# Patient Record
Sex: Female | Born: 1940 | Race: White | Hispanic: No | Marital: Single | Smoking: Former smoker
Health system: Southern US, Community
[De-identification: ages and names within clinical notes are randomized; demographics above are authoritative.]

## PROBLEM LIST (undated history)

## (undated) DIAGNOSIS — I10 Essential (primary) hypertension: Secondary | ICD-10-CM

## (undated) DIAGNOSIS — M1990 Unspecified osteoarthritis, unspecified site: Secondary | ICD-10-CM

## (undated) DIAGNOSIS — E739 Lactose intolerance, unspecified: Secondary | ICD-10-CM

## (undated) DIAGNOSIS — E7800 Pure hypercholesterolemia, unspecified: Secondary | ICD-10-CM

## (undated) DIAGNOSIS — J45909 Unspecified asthma, uncomplicated: Secondary | ICD-10-CM

## (undated) DIAGNOSIS — M8580 Other specified disorders of bone density and structure, unspecified site: Secondary | ICD-10-CM

## (undated) DIAGNOSIS — H409 Unspecified glaucoma: Secondary | ICD-10-CM

## (undated) HISTORY — DX: Essential (primary) hypertension: I10

## (undated) HISTORY — PX: EYE SURGERY: SHX253

## (undated) HISTORY — DX: Unspecified osteoarthritis, unspecified site: M19.90

## (undated) HISTORY — DX: Pure hypercholesterolemia, unspecified: E78.00

## (undated) HISTORY — DX: Lactose intolerance, unspecified: E73.9

## (undated) HISTORY — PX: FRACTURE SURGERY: SHX138

## (undated) HISTORY — DX: Unspecified asthma, uncomplicated: J45.909

---

## 1999-06-21 ENCOUNTER — Other Ambulatory Visit: Admission: RE | Admit: 1999-06-21 | Discharge: 1999-06-21 | Payer: Self-pay | Admitting: Internal Medicine

## 1999-07-26 ENCOUNTER — Ambulatory Visit (HOSPITAL_COMMUNITY): Admission: RE | Admit: 1999-07-26 | Discharge: 1999-07-26 | Payer: Self-pay | Admitting: Gastroenterology

## 2002-05-22 ENCOUNTER — Encounter: Admission: RE | Admit: 2002-05-22 | Discharge: 2002-05-22 | Payer: Self-pay | Admitting: Internal Medicine

## 2002-05-22 ENCOUNTER — Encounter: Payer: Self-pay | Admitting: Internal Medicine

## 2002-10-27 ENCOUNTER — Encounter: Payer: Self-pay | Admitting: Internal Medicine

## 2002-10-27 ENCOUNTER — Encounter: Admission: RE | Admit: 2002-10-27 | Discharge: 2002-10-27 | Payer: Self-pay | Admitting: Internal Medicine

## 2003-09-29 ENCOUNTER — Other Ambulatory Visit: Admission: RE | Admit: 2003-09-29 | Discharge: 2003-09-29 | Payer: Self-pay | Admitting: Internal Medicine

## 2007-04-04 ENCOUNTER — Encounter: Admission: RE | Admit: 2007-04-04 | Discharge: 2007-04-04 | Payer: Self-pay | Admitting: Internal Medicine

## 2010-05-27 ENCOUNTER — Encounter: Payer: Self-pay | Admitting: Internal Medicine

## 2011-07-06 MED ORDER — HYDROMORPHONE HCL PF 1 MG/ML IJ SOLN
INTRAMUSCULAR | Status: AC
  Filled 2011-07-06: qty 1

## 2011-07-20 ENCOUNTER — Other Ambulatory Visit: Payer: Self-pay | Admitting: Orthopedic Surgery

## 2011-07-24 ENCOUNTER — Encounter (HOSPITAL_BASED_OUTPATIENT_CLINIC_OR_DEPARTMENT_OTHER)
Admission: RE | Admit: 2011-07-24 | Discharge: 2011-07-24 | Disposition: A | Discharge status: Home / Self Care | Payer: Medicare Other | Source: Ambulatory Visit | Attending: Orthopedic Surgery | Admitting: Orthopedic Surgery

## 2011-07-24 ENCOUNTER — Encounter (HOSPITAL_BASED_OUTPATIENT_CLINIC_OR_DEPARTMENT_OTHER): Payer: Self-pay | Admitting: *Deleted

## 2011-07-24 LAB — BASIC METABOLIC PANEL
BUN: 13 mg/dL (ref 6–23)
Calcium: 9.4 mg/dL (ref 8.4–10.5)
GFR calc Af Amer: >90 mL/min (ref >90)
GFR calc non Af Amer: 88 mL/min — ABNORMAL LOW (ref >90)
Glucose, Bld: 86 mg/dL (ref 70–99)
Sodium: 139 mEq/L (ref 135–145)

## 2011-07-24 NOTE — Progress Notes (Signed)
To come in for ekg/bmet Coming from [facility]

## 2011-07-25 ENCOUNTER — Encounter (HOSPITAL_BASED_OUTPATIENT_CLINIC_OR_DEPARTMENT_OTHER): Payer: Self-pay | Admitting: *Deleted

## 2011-07-25 ENCOUNTER — Encounter (HOSPITAL_BASED_OUTPATIENT_CLINIC_OR_DEPARTMENT_OTHER): Payer: Self-pay | Admitting: Anesthesiology

## 2011-07-25 ENCOUNTER — Ambulatory Visit (HOSPITAL_BASED_OUTPATIENT_CLINIC_OR_DEPARTMENT_OTHER): Payer: Medicare Other | Admitting: Anesthesiology

## 2011-07-25 ENCOUNTER — Ambulatory Visit (HOSPITAL_BASED_OUTPATIENT_CLINIC_OR_DEPARTMENT_OTHER)
Admission: RE | Admit: 2011-07-25 | Discharge: 2011-07-25 | Disposition: A | Discharge status: Home / Self Care | Payer: Medicare Other | Source: Ambulatory Visit | Attending: Orthopedic Surgery | Admitting: Orthopedic Surgery

## 2011-07-25 ENCOUNTER — Encounter (HOSPITAL_BASED_OUTPATIENT_CLINIC_OR_DEPARTMENT_OTHER)
Admission: RE | Disposition: A | Discharge status: Home / Self Care | Payer: Self-pay | Source: Ambulatory Visit | Attending: Orthopedic Surgery

## 2011-07-25 DIAGNOSIS — J45909 Unspecified asthma, uncomplicated: Secondary | ICD-10-CM | POA: Insufficient documentation

## 2011-07-25 DIAGNOSIS — M720 Palmar fascial fibromatosis [Dupuytren]: Secondary | ICD-10-CM | POA: Insufficient documentation

## 2011-07-25 DIAGNOSIS — Z01812 Encounter for preprocedural laboratory examination: Secondary | ICD-10-CM | POA: Insufficient documentation

## 2011-07-25 DIAGNOSIS — I10 Essential (primary) hypertension: Secondary | ICD-10-CM | POA: Insufficient documentation

## 2011-07-25 DIAGNOSIS — Z01810 Encounter for preprocedural cardiovascular examination: Secondary | ICD-10-CM | POA: Insufficient documentation

## 2011-07-25 DIAGNOSIS — G5600 Carpal tunnel syndrome, unspecified upper limb: Secondary | ICD-10-CM | POA: Insufficient documentation

## 2011-07-25 DIAGNOSIS — M1990 Unspecified osteoarthritis, unspecified site: Secondary | ICD-10-CM | POA: Insufficient documentation

## 2011-07-25 DIAGNOSIS — H409 Unspecified glaucoma: Secondary | ICD-10-CM | POA: Insufficient documentation

## 2011-07-25 HISTORY — PX: DUPUYTREN CONTRACTURE RELEASE: SHX1478

## 2011-07-25 HISTORY — PX: CARPAL TUNNEL RELEASE: SHX101

## 2011-07-25 HISTORY — DX: Unspecified glaucoma: H40.9

## 2011-07-25 HISTORY — DX: Other specified disorders of bone density and structure, unspecified site: M85.80

## 2011-07-25 LAB — POCT HEMOGLOBIN-HEMACUE: Hemoglobin: 10.9 g/dL — ABNORMAL LOW (ref 12.0–15.0)

## 2011-07-25 SURGERY — CARPAL TUNNEL RELEASE
Anesthesia: General | Site: Hand | Laterality: Right | Wound class: Clean

## 2011-07-25 MED ORDER — FENTANYL CITRATE 0.05 MG/ML IJ SOLN
INTRAMUSCULAR | Status: DC | PRN
  Administered 2011-07-25: 50 ug via INTRAVENOUS

## 2011-07-25 MED ORDER — LIDOCAINE HCL (CARDIAC) 20 MG/ML IV SOLN
INTRAVENOUS | Status: DC | PRN
  Administered 2011-07-25: 60 mg via INTRAVENOUS

## 2011-07-25 MED ORDER — FENTANYL CITRATE 0.05 MG/ML IJ SOLN: 50.0000 ug | INTRAMUSCULAR | Status: DC | PRN

## 2011-07-25 MED ORDER — CHLORHEXIDINE GLUCONATE 4 % EX LIQD: 60.0000 mL | Freq: Once | CUTANEOUS | Status: DC

## 2011-07-25 MED ORDER — MIDAZOLAM HCL 2 MG/2ML IJ SOLN: 0.5000 mg | INTRAMUSCULAR | Status: DC | PRN

## 2011-07-25 MED ORDER — ONDANSETRON HCL 4 MG/2ML IJ SOLN
INTRAMUSCULAR | Status: DC | PRN
  Administered 2011-07-25: 4 mg via INTRAVENOUS

## 2011-07-25 MED ORDER — PROPOFOL 10 MG/ML IV EMUL
INTRAVENOUS | Status: DC | PRN
  Administered 2011-07-25: 200 mg via INTRAVENOUS

## 2011-07-25 MED ORDER — OXYCODONE HCL 5 MG PO TABS: 5.0000 mg | ORAL_TABLET | Freq: Once | ORAL | Status: DC | PRN

## 2011-07-25 MED ORDER — MIDAZOLAM HCL 5 MG/5ML IJ SOLN
INTRAMUSCULAR | Status: DC | PRN
  Administered 2011-07-25 (×2): 1 mg via INTRAVENOUS

## 2011-07-25 MED ORDER — LIDOCAINE HCL 2 % IJ SOLN
INTRAMUSCULAR | Status: DC | PRN
  Administered 2011-07-25: 3 mL

## 2011-07-25 MED ORDER — OXYCODONE-ACETAMINOPHEN 5-325 MG PO TABS: 1.0000 | ORAL_TABLET | Freq: Four times a day (QID) | ORAL | Status: DC | PRN

## 2011-07-25 MED ORDER — METOCLOPRAMIDE HCL 5 MG/ML IJ SOLN
INTRAMUSCULAR | Status: DC | PRN
  Administered 2011-07-25: 10 mg via INTRAVENOUS

## 2011-07-25 MED ORDER — METOCLOPRAMIDE HCL 5 MG/ML IJ SOLN: 10.0000 mg | Freq: Once | INTRAMUSCULAR | Status: DC | PRN

## 2011-07-25 MED ORDER — 0.9 % SODIUM CHLORIDE (POUR BTL) OPTIME
TOPICAL | Status: DC | PRN
  Administered 2011-07-25: 1000 mL

## 2011-07-25 MED ORDER — DEXAMETHASONE SODIUM PHOSPHATE 10 MG/ML IJ SOLN
INTRAMUSCULAR | Status: DC | PRN
  Administered 2011-07-25: 10 mg via INTRAVENOUS

## 2011-07-25 MED ORDER — LACTATED RINGERS IV SOLN
INTRAVENOUS | Status: DC
  Administered 2011-07-25: 08:00:00 via INTRAVENOUS

## 2011-07-25 MED ORDER — FENTANYL CITRATE 0.05 MG/ML IJ SOLN: 25.0000 ug | INTRAMUSCULAR | Status: DC | PRN

## 2011-07-25 SURGICAL SUPPLY — 55 items
BANDAGE ADHESIVE 1X3 (GAUZE/BANDAGES/DRESSINGS) IMPLANT
BANDAGE CONFORM 3  STR LF (GAUZE/BANDAGES/DRESSINGS) IMPLANT
BANDAGE ELASTIC 3 VELCRO ST LF (GAUZE/BANDAGES/DRESSINGS) ×2 IMPLANT
BANDAGE GAUZE ELAST BULKY 4 IN (GAUZE/BANDAGES/DRESSINGS) ×3 IMPLANT
BLADE MINI RND TIP GREEN BEAV (BLADE) ×1 IMPLANT
BLADE SURG 15 STRL LF DISP TIS (BLADE) ×1 IMPLANT
BLADE SURG 15 STRL SS (BLADE) ×2
BNDG CMPR 9X4 STRL LF SNTH (GAUZE/BANDAGES/DRESSINGS) ×1
BNDG ESMARK 4X9 LF (GAUZE/BANDAGES/DRESSINGS) ×1 IMPLANT
BRUSH SCRUB EZ PLAIN DRY (MISCELLANEOUS) ×2 IMPLANT
CLOTH BEACON ORANGE TIMEOUT ST (SAFETY) ×1 IMPLANT
CORDS BIPOLAR (ELECTRODE) ×2 IMPLANT
COVER MAYO STAND STRL (DRAPES) ×2 IMPLANT
COVER TABLE BACK 60X90 (DRAPES) ×2 IMPLANT
CUFF TOURNIQUET SINGLE 18IN (TOURNIQUET CUFF) ×1 IMPLANT
DECANTER SPIKE VIAL GLASS SM (MISCELLANEOUS) IMPLANT
DRAPE EXTREMITY T 121X128X90 (DRAPE) ×2 IMPLANT
DRAPE SURG 17X23 STRL (DRAPES) ×2 IMPLANT
DRSG EMULSION OIL 3X3 NADH (GAUZE/BANDAGES/DRESSINGS) ×3 IMPLANT
GLOVE BIO SURGEON STRL SZ 6.5 (GLOVE) ×1 IMPLANT
GLOVE BIOGEL M STRL SZ7.5 (GLOVE) ×1 IMPLANT
GLOVE BIOGEL PI IND STRL 7.0 (GLOVE) IMPLANT
GLOVE BIOGEL PI INDICATOR 7.0 (GLOVE) ×1
GLOVE EXAM NITRILE EXT CUFF MD (GLOVE) ×1 IMPLANT
GLOVE ORTHO TXT STRL SZ7.5 (GLOVE) ×2 IMPLANT
GOWN PREVENTION PLUS XLARGE (GOWN DISPOSABLE) ×2 IMPLANT
GOWN PREVENTION PLUS XXLARGE (GOWN DISPOSABLE) ×3 IMPLANT
LOOP VESSEL MAXI BLUE (MISCELLANEOUS) ×1 IMPLANT
NDL HYPO 25X1 1.5 SAFETY (NEEDLE) IMPLANT
NEEDLE 27GAX1X1/2 (NEEDLE) ×1 IMPLANT
NEEDLE HYPO 25X1 1.5 SAFETY (NEEDLE) IMPLANT
NS IRRIG 1000ML POUR BTL (IV SOLUTION) ×2 IMPLANT
PACK BASIN DAY SURGERY FS (CUSTOM PROCEDURE TRAY) ×2 IMPLANT
PAD CAST 3X4 CTTN HI CHSV (CAST SUPPLIES) ×1 IMPLANT
PADDING CAST ABS 4INX4YD NS (CAST SUPPLIES) ×1
PADDING CAST ABS COTTON 4X4 ST (CAST SUPPLIES) ×1 IMPLANT
PADDING CAST COTTON 3X4 STRL (CAST SUPPLIES) ×2
SLEEVE SCD COMPRESS KNEE MED (MISCELLANEOUS) ×1 IMPLANT
SPLINT PLASTER CAST XFAST 3X15 (CAST SUPPLIES) ×5 IMPLANT
SPLINT PLASTER XTRA FASTSET 3X (CAST SUPPLIES) ×5
SPONGE GAUZE 4X4 12PLY (GAUZE/BANDAGES/DRESSINGS) ×2 IMPLANT
STOCKINETTE 4X48 STRL (DRAPES) ×2 IMPLANT
STRIP CLOSURE SKIN 1/2X4 (GAUZE/BANDAGES/DRESSINGS) ×2 IMPLANT
SUT ETHILON 5 0 P 3 18 (SUTURE)
SUT NYLON ETHILON 5-0 P-3 1X18 (SUTURE) ×2 IMPLANT
SUT PROLENE 3 0 PS 2 (SUTURE) ×3 IMPLANT
SUT SILK 4 0 PS 2 (SUTURE) ×1 IMPLANT
SUT VIC AB 4-0 P2 18 (SUTURE) IMPLANT
SYR 3ML 23GX1 SAFETY (SYRINGE) IMPLANT
SYR BULB 3OZ (MISCELLANEOUS) ×1 IMPLANT
SYR CONTROL 10ML LL (SYRINGE) ×1 IMPLANT
TOWEL OR 17X24 6PK STRL BLUE (TOWEL DISPOSABLE) ×4 IMPLANT
TRAY DSU PREP LF (CUSTOM PROCEDURE TRAY) ×2 IMPLANT
UNDERPAD 30X30 INCONTINENT (UNDERPADS AND DIAPERS) ×2 IMPLANT
WATER STERILE IRR 1000ML POUR (IV SOLUTION) ×1 IMPLANT

## 2011-07-25 NOTE — H&P (Signed)
Maria Kelley is an 71 y.o. female.   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  Past Medical History  Diagnosis Date  . Essential hypertension, malignant   . Pure hypercholesterolemia   . Extrinsic asthma, unspecified     no inhalers  . Osteoarthrosis, unspecified whether generalized or localized, unspecified site   . Osteopenia   . Glaucoma     Past Surgical History  Procedure Date  . Eye surgery     both cataracts  . Fracture surgery     rt lower leg    No family history on file. Social History:  reports that she quit smoking about 30 years ago. She does not have any smokeless tobacco history on file. She reports that she drinks alcohol. She reports that she does not use illicit drugs.  Allergies: No Known Allergies  Medications Prior to Admission  Medication Sig Dispense Refill  . amLODipine (NORVASC) 5 MG tablet Take 5 mg by mouth daily.      . benazepril (LOTENSIN) 20 MG tablet Take 20 mg by mouth daily.      . Calcium Carbonate-Vitamin D (CALCIUM + D PO) Take by mouth daily.        . IBUPROFEN PO Take 200 mg by mouth daily.        Marland Kitchen MAGNESIUM PO Take by mouth daily.        . metoprolol (TOPROL-XL) 100 MG 24 hr tablet Take 100 mg by mouth 2 (two) times daily. 1/2 tablet bid      . Omega-3 Fatty Acids (OMEGA 3 PO) Take by mouth daily.        . Timolol Maleate (TIMOPTIC OP) Apply to eye 2 (two) times daily.        . travoprost, benzalkonium, (TRAVATAN Z) 0.004 % ophthalmic solution 1 drop at bedtime.        Marland Kitchen ipratropium (ATROVENT HFA) 17 MCG/ACT inhaler Inhale 2 puffs into the lungs 2 (two) times daily as needed.          Results for orders placed during the hospital encounter of 07/25/11 (from the past 48 hour(s))  BASIC METABOLIC PANEL     Status: Abnormal   Collection Time   07/24/11 12:35 PM      Component Value Range Comment   Sodium 139  135 -  145 (mEq/L)    Potassium 5.4 (*) 3.5 - 5.1 (mEq/L)    Chloride 103  96 - 112 (mEq/L)    CO2 29  19 - 32 (mEq/L)    Glucose, Bld 86  70 - 99 (mg/dL)    BUN 13  6 - 23 (mg/dL)    Creatinine, Ser 1.61  0.50 - 1.10 (mg/dL)    Calcium 9.4  8.4 - 10.5 (mg/dL)    GFR calc non Af Amer 88 (*) >90 (mL/min)    GFR calc Af Amer >90  >90 (mL/min)   POCT HEMOGLOBIN-HEMACUE     Status: Abnormal   Collection Time   07/25/11  7:41 AM      Component Value Range Comment   Hemoglobin 10.9 (*) 12.0 - 15.0 (g/dL)    No results found.  Review of Systems  Constitutional: Negative.   Eyes: Negative.   Respiratory: Negative.   Cardiovascular: Negative.   Gastrointestinal: Negative.   Genitourinary: Negative.   Musculoskeletal: Negative.   Skin: Negative.   Neurological: Positive for tingling and sensory  change.  Endo/Heme/Allergies: Negative.   Psychiatric/Behavioral: Negative.     Blood pressure 130/78, pulse 49, temperature 98.2 F (36.8 C), temperature source Oral, resp. rate 16, height 5' (1.524 m), weight 56.7 kg (125 lb), SpO2 98.00%. Physical Exam  Constitutional: She is oriented to person, place, and time. She appears well-developed.  HENT:  Head: Normocephalic and atraumatic.  Eyes: Pupils are equal, round, and reactive to light.  Neck: Normal range of motion.  Cardiovascular: Normal rate.   Respiratory: Breath sounds normal.  GI: Soft.  Musculoskeletal: Normal range of motion.  Neurological: She is alert and oriented to person, place, and time.  Skin: Skin is warm.   Numbness in right median distribution with positive direct compression test  Assessment/Plan Right carpal tunnel syndrome with palmar fibromatosis of long finger ray  Right carpal tunnel release with excision of palmar fascia  Aundraya Dripps JR,Maalik Pinn V 07/25/2011, 8:37 AM

## 2011-07-25 NOTE — Anesthesia Postprocedure Evaluation (Signed)
Anesthesia Post Note  Patient: Maria Kelley  Procedure(s) Performed: Procedure(s) (LRB): CARPAL TUNNEL RELEASE (Right) DUPUYTREN CONTRACTURE RELEASE (Right)  Anesthesia type: General  Patient location: PACU  Post pain: Pain level controlled  Post assessment: Patient's Cardiovascular Status Stable  Last Vitals:  Filed Vitals:   07/25/11 1104  BP: 139/65  Pulse: 55  Temp: 36.5 C  Resp: 20    Post vital signs: Reviewed and stable  Level of consciousness: alert  Complications: No apparent anesthesia complications

## 2011-07-25 NOTE — Anesthesia Preprocedure Evaluation (Addendum)
Anesthesia Evaluation  Patient identified by MRN, date of birth, ID band Patient awake    Reviewed: Allergy & Precautions, H&P , NPO status , Patient's Chart, lab work & pertinent test results, reviewed documented beta blocker date and time   Airway Mallampati: II TM Distance: >3 FB Neck ROM: full    Dental   Pulmonary asthma ,          Cardiovascular hypertension, On Medications and On Home Beta Blockers     Neuro/Psych negative neurological ROS  negative psych ROS   GI/Hepatic negative GI ROS, Neg liver ROS,   Endo/Other  negative endocrine ROS  Renal/GU negative Renal ROS  negative genitourinary   Musculoskeletal   Abdominal   Peds  Hematology negative hematology ROS (+)   Anesthesia Other Findings See surgeon's H&P   Reproductive/Obstetrics negative OB ROS                           Anesthesia Physical Anesthesia Plan  ASA: II  Anesthesia Plan: General   Post-op Pain Management:    Induction:   Airway Management Planned: LMA  Additional Equipment:   Intra-op Plan:   Post-operative Plan: Extubation in OR  Informed Consent: I have reviewed the patients History and Physical, chart, labs and discussed the procedure including the risks, benefits and alternatives for the proposed anesthesia with the patient or authorized representative who has indicated his/her understanding and acceptance.   Dental Advisory Given  Plan Discussed with: CRNA and Surgeon  Anesthesia Plan Comments:         Anesthesia Quick Evaluation

## 2011-07-25 NOTE — Transfer of Care (Signed)
Immediate Anesthesia Transfer of Care Note  Patient: Maria Kelley  Procedure(s) Performed: Procedure(s) (LRB): CARPAL TUNNEL RELEASE (Right) DUPUYTREN CONTRACTURE RELEASE (Right)  Patient Location: PACU  Anesthesia Type: General  Level of Consciousness: sedated  Airway & Oxygen Therapy: Patient Spontanous Breathing and Patient connected to face mask oxygen  Post-op Assessment: Report given to PACU RN and Post -op Vital signs reviewed and stable  Post vital signs: Reviewed and stable  Complications: No apparent anesthesia complications

## 2011-07-25 NOTE — Anesthesia Procedure Notes (Signed)
Procedure Name: LMA Insertion Date/Time: 07/25/2011 8:54 AM Performed by: Burna Cash Pre-anesthesia Checklist: Patient identified, Emergency Drugs available, Suction available and Patient being monitored Patient Re-evaluated:Patient Re-evaluated prior to inductionOxygen Delivery Method: Circle System Utilized Preoxygenation: Pre-oxygenation with 100% oxygen Intubation Type: IV induction Ventilation: Mask ventilation without difficulty LMA: LMA inserted LMA Size: 4.0 Number of attempts: 1 Airway Equipment and Method: bite block Placement Confirmation: positive ETCO2 Tube secured with: Tape Dental Injury: Teeth and Oropharynx as per pre-operative assessment

## 2011-07-25 NOTE — Op Note (Signed)
Dictated [id number]

## 2011-07-25 NOTE — Brief Op Note (Signed)
07/25/2011  9:27 AM  PATIENT:  Maria Kelley  71 y.o. female  PRE-OPERATIVE DIAGNOSIS:  Carpal tunnel syndrome right, dupuytrens right long  POST-OPERATIVE DIAGNOSIS:  Carpal tunnel syndrome right, dupuytrens right long  PROCEDURE:   CARPAL TUNNEL RELEASE (Right) DUPUYTREN CONTRACTURE PALMAR FASCIA EXCISION RIGHT PALM  SURGEON:      * Wyn Forster., MD - Primary  PHYSICIAN ASSISTANT:   ASSISTANTS: nurse  ANESTHESIA:   general  EBL:     BLOOD ADMINISTERED:none  DRAINS: none   LOCAL MEDICATIONS USED:  XYLOCAINE   SPECIMEN:  No Specimen  DISPOSITION OF SPECIMEN:  N/A  COUNTS:  YES  TOURNIQUET:   Total Tourniquet Time Documented: Upper Arm (Right) - 19 minutes  DICTATION: .Other Dictation: Dictation Number [id number]  PLAN OF CARE: Discharge to home after PACU  PATIENT DISPOSITION:  PACU - hemodynamically stable.

## 2011-07-25 NOTE — Discharge Instructions (Signed)
  Post Anesthesia Home Care Instructions  Activity: Get plenty of rest for the remainder of the day. A responsible adult should stay with you for 24 hours following the procedure.  For the next 24 hours, DO NOT: -Drive a car -Advertising copywriter -Drink alcoholic beverages -Take any medication unless instructed by your physician -Make any legal decisions or sign important papers.  Meals: Start with liquid foods such as gelatin or soup. Progress to regular foods as tolerated. Avoid greasy, spicy, heavy foods. If nausea and/or vomiting occur, drink only clear liquids until the nausea and/or vomiting subsides. Call your physician if vomiting continues.  Special Instructions/Symptoms: Your throat may feel dry or sore from the anesthesia or the breathing tube placed in your throat during surgery. If this causes discomfort, gargle with warm salt water. The discomfort should disappear within 24 hours.     You develop bleeding from your surgical site.   You have an oral temperature above 102 F (38.9 C).   You develop redness or swelling of the surgical site.   You develop new, unexplained problems.  SEEK IMMEDIATE MEDICAL CARE IF:   You develop a rash.   You have difficulty breathing.   You develop any reaction or side effects to medicines given.  MAKE SURE YOU:   Understand these instructions.   Will watch your condition.   Will get help right away if you are not doing well or get worse.  Document Released: 08/19/2004 Document Revised: 01/19/2011 Document Reviewed: 12/06/2006 [facility] Patient Information 2012 [place], [place].

## 2011-07-26 ENCOUNTER — Encounter (HOSPITAL_BASED_OUTPATIENT_CLINIC_OR_DEPARTMENT_OTHER): Payer: Self-pay | Admitting: Orthopedic Surgery

## 2011-07-26 NOTE — Op Note (Signed)
NAMESANVI, Maria Kelley               ACCOUNT NO.:  [id number]  MEDICAL RECORD NO.:  [id number]  LOCATION:                                 FACILITY:  PHYSICIAN:  Katy Fitch. Dawanna Grauberger, M.D.      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  OPERATIONS: 1. Release of right transverse carpal ligament. 2. Resection of pretendinous fibers of palmar fascia with pathologic     palmar fibromatosis, right palm.  OPERATING SURGEON:  Katy Fitch. Charda Janis, MD  ASSISTANT:  None.  ANESTHESIA:  General by LMA.  SUPERVISING ANESTHESIOLOGIST:  Janetta Hora. Gelene Mink, MD  INDICATIONS:  Ashante Yellin is a 71 year old woman referred through the courtesy of Dr. Willey Blade for evaluation and management of hand numbness and palmar fibromatosis.  Clinical examination of her hand revealed a significant palmar fibromatosis involving the pretendinous fibers of the right long finger.  She had significant numbness and mild thenar atrophy.  She was evaluated by Dr. Johna Roles with detailed electrodiagnostic studies, which confirmed severe right carpal tunnel syndrome and moderate left carpal tunnel syndrome.  Due to a failure to respond to nonoperative measures, she is brought to the operating room at this time for release of her right transverse carpal ligament and resection of her pathologic fascia.  Preoperatively, she was reminded the potential risks and benefits of surgery.  Questions were invited and answered in detail.  PROCEDURE:  Naydene Kamrowski was brought to room #6 of [facility] and placed in supine position on the operating table.  Following detailed anesthesia and informed consent by Dr. Gelene Mink, general anesthesia by LMA technique was recommended and accepted.  In room #6 under Dr. Thornton Dales direct supervision, general anesthesia by LMA technique was induced followed by routine Betadine scrub and paint of the right upper extremity.  A pneumatic tourniquet was applied to the proximal right brachium.  Following routine surgical time-out, the right hand and arm were exsanguinated with Esmarch bandage and arterial tourniquet was inflated to 220 mmHg.  Procedure commenced with a short incision in the line of the ring finger of the palm.  Subcutaneous tissues were carefully divided revealing the palmar fascia.  This was split longitudinally to reveal the common sensory branch of the median nerve.  These were followed back to the median nerve proper, which was gently isolated from the transverse carpal ligament with a Insurance risk surveyor.  A subcutaneous pathway was created into the distal forearm followed by release of the ulnar bursa along its ulnar border extending into the distal forearm.  This widely opened the carpal canal.  No masses or other predicaments were noted.  Bleeding points along the margin of the released ligament were electrocauterized with bipolar current.  Attention was then directed to the midpalm.  A Brunner zigzag incision  was fashioned between the distal and middle palmar creases.  A 2-cm segment of the pathologic palmar fascia was resected as well as its extensions to the transverse bands of the palmar fascia.  Care was taken to identify and protect the neurovascular structures as well as the flexor tendons.  This relieved the significant contracture in the palm.  Both wounds were then irrigated, inspected for bleeding points and repaired with intradermal 3-0 Prolene and  Steri-Strips.  A 3 mL of 2% lidocaine were infiltrated along the wound margins and around the median nerve for postoperative analgesia.  The hand was then dressed with sterile gauze, sterile Kerlix, sterile Webril, and a volar plaster splint maintaining the wrist in 15 degrees of dorsiflexion.  For aftercare, Ms. Kluger is provided a prescription for Percocet 5 mg 1 p.o. q.4-6 hours p.r.n. pain, 20 tablets without refill.     Katy Fitch Smera Guyette, M.D.     RVS/MEDQ  D:  07/25/2011  T:  07/25/2011  Job:  [id number]

## 2012-03-01 LAB — BASIC METABOLIC PANEL
BUN: 16 mg/dL (ref 4–21)
Creatinine: 0.6 mg/dL (ref 0.5–1.1)

## 2012-03-01 LAB — HEPATIC FUNCTION PANEL
AST: 15 U/L (ref 13–35)
Alkaline Phosphatase: 41 U/L (ref 25–125)
Bilirubin, Total: 0.9 mg/dL

## 2012-03-01 LAB — LIPID PANEL
HDL: 66 mg/dL (ref 35–70)
LDL Cholesterol: 12 mg/dL

## 2012-04-04 ENCOUNTER — Encounter: Payer: Self-pay | Admitting: *Deleted

## 2012-04-12 ENCOUNTER — Encounter: Payer: Self-pay | Admitting: Hematology

## 2013-05-06 ENCOUNTER — Ambulatory Visit: Payer: Medicare Other | Admitting: Physician Assistant

## 2013-08-25 ENCOUNTER — Other Ambulatory Visit (HOSPITAL_COMMUNITY): Payer: Self-pay | Admitting: Internal Medicine

## 2013-08-25 ENCOUNTER — Ambulatory Visit (HOSPITAL_COMMUNITY): Payer: Medicare PPO

## 2013-08-25 DIAGNOSIS — M7989 Other specified soft tissue disorders: Secondary | ICD-10-CM

## 2013-08-25 DIAGNOSIS — M25562 Pain in left knee: Secondary | ICD-10-CM

## 2013-08-26 ENCOUNTER — Ambulatory Visit (HOSPITAL_COMMUNITY)
Admission: RE | Admit: 2013-08-26 | Discharge: 2013-08-26 | Disposition: A | Discharge status: Home / Self Care | Payer: Medicare PPO | Source: Ambulatory Visit | Attending: Internal Medicine | Admitting: Internal Medicine

## 2013-08-26 DIAGNOSIS — M7989 Other specified soft tissue disorders: Secondary | ICD-10-CM | POA: Insufficient documentation

## 2013-08-26 DIAGNOSIS — M79609 Pain in unspecified limb: Secondary | ICD-10-CM

## 2013-08-26 DIAGNOSIS — M25562 Pain in left knee: Secondary | ICD-10-CM

## 2013-08-26 NOTE — Progress Notes (Signed)
VASCULAR LAB PRELIMINARY  PRELIMINARY  PRELIMINARY  PRELIMINARY  Left lower extremity venous duplex completed.    Preliminary report:  Left:  No evidence of DVTor superficial thrombosis. Positive for  a ruptured Baker's cyst with fluid coursing approximately 9 cm from the popliteal fossa to the proximal calf.  Haik Mahoney, RVS 08/26/2013, 10:40 AM

## 2015-03-23 ENCOUNTER — Emergency Department (HOSPITAL_COMMUNITY)
Admission: EM | Admit: 2015-03-23 | Discharge: 2015-03-23 | Disposition: A | Discharge status: Home / Self Care | Payer: Medicare HMO | Attending: Emergency Medicine | Admitting: Emergency Medicine

## 2015-03-23 ENCOUNTER — Encounter (HOSPITAL_COMMUNITY): Payer: Self-pay

## 2015-03-23 ENCOUNTER — Emergency Department (HOSPITAL_COMMUNITY): Payer: Medicare HMO

## 2015-03-23 DIAGNOSIS — Z79899 Other long term (current) drug therapy: Secondary | ICD-10-CM | POA: Diagnosis not present

## 2015-03-23 DIAGNOSIS — Z87891 Personal history of nicotine dependence: Secondary | ICD-10-CM | POA: Diagnosis not present

## 2015-03-23 DIAGNOSIS — J45909 Unspecified asthma, uncomplicated: Secondary | ICD-10-CM | POA: Insufficient documentation

## 2015-03-23 DIAGNOSIS — Z8639 Personal history of other endocrine, nutritional and metabolic disease: Secondary | ICD-10-CM | POA: Insufficient documentation

## 2015-03-23 DIAGNOSIS — I10 Essential (primary) hypertension: Secondary | ICD-10-CM | POA: Insufficient documentation

## 2015-03-23 DIAGNOSIS — H409 Unspecified glaucoma: Secondary | ICD-10-CM | POA: Insufficient documentation

## 2015-03-23 DIAGNOSIS — M25531 Pain in right wrist: Secondary | ICD-10-CM | POA: Insufficient documentation

## 2015-03-23 DIAGNOSIS — M1990 Unspecified osteoarthritis, unspecified site: Secondary | ICD-10-CM | POA: Diagnosis not present

## 2015-03-23 MED ORDER — IBUPROFEN 200 MG PO TABS
600.0000 mg | ORAL_TABLET | Freq: Once | ORAL | Status: AC
  Administered 2015-03-23: 600 mg via ORAL
  Filled 2015-03-23: qty 3

## 2015-03-23 NOTE — ED Notes (Signed)
Pt states started having pain in rt hand last night.  No injury.  Some swelling noted in hand.  No hx of same.

## 2015-03-23 NOTE — Discharge Instructions (Signed)
Follow up with your hand surgeon.  Im not sure if Dr. Teressa Senter is still working.  Please call the number provided. Take  of ibuprofen 3 times a day with meals.  Wear the splint for comfort at and night.  Joint Pain Joint pain, which is also called arthralgia, can be caused by many things. Joint pain often goes away when you follow your health care provider's instructions for relieving pain at home. However, joint pain can also be caused by conditions that require further treatment. Common causes of joint pain include:  Bruising in the area of the joint.  Overuse of the joint.  Wear and tear on the joints that occur with aging (osteoarthritis).  Various other forms of arthritis.  A buildup of a crystal form of uric acid in the joint (gout).  Infections of the joint (septic arthritis) or of the bone (osteomyelitis). Your health care provider may recommend medicine to help with the pain. If your joint pain continues, additional tests may be needed to diagnose your condition. HOME CARE INSTRUCTIONS Watch your condition for any changes. Follow these instructions as directed to lessen the pain that you are feeling.  Take medicines only as directed by your health care provider.  Rest the affected area for as long as your health care provider says that you should. If directed to do so, raise the painful joint above the level of your heart while you are sitting or lying down.  Do not do things that cause or worsen pain.  If directed, apply ice to the painful area:  Put ice in a plastic bag.  Place a towel between your skin and the bag.  Leave the ice on for 20 minutes, 2-3 times per day.  Wear an elastic bandage, splint, or sling as directed by your health care provider. Loosen the elastic bandage or splint if your fingers or toes become numb and tingle, or if they turn cold and blue.  Begin exercising or stretching the affected area as directed by your health care provider. Ask your  health care provider what types of exercise are safe for you.  Keep all follow-up visits as directed by your health care provider. This is important. SEEK MEDICAL CARE IF:  Your pain increases, and medicine does not help.  Your joint pain does not improve within 3 days.  You have increased bruising or swelling.  You have a fever.  You lose 10 lb (4.5 kg) or more without trying. SEEK IMMEDIATE MEDICAL CARE IF:  You are not able to move the joint.  Your fingers or toes become numb or they turn cold and blue.   This information is not intended to replace advice given to you by your health care provider. Make sure you discuss any questions you have with your health care provider.   Document Released: 01/30/2005 Document Revised: 02/20/2014 Document Reviewed: 11/11/2013 [employer] Interactive Patient Education [employer]

## 2015-03-23 NOTE — ED Provider Notes (Signed)
CSN: [id number]     Arrival date & time 03/23/15  0940 History   First MD Initiated Contact with Patient 03/23/15 1006     Chief Complaint  Patient presents with  . Hand Pain     (Consider location/radiation/quality/duration/timing/severity/associated sxs/prior Treatment) Patient is a 75 y.o. female presenting with hand pain. The history is provided by the patient.  Hand Pain This is a new problem. The current episode started less than 1 hour ago. The problem occurs constantly. The problem has not changed since onset.Pertinent negatives include no chest pain, no abdominal pain, no headaches and no shortness of breath. Nothing aggravates the symptoms. Nothing relieves the symptoms. She has tried nothing for the symptoms. The treatment provided no relief.   61 yoF with a chief complaint of right wrist pain. The started yesterday when she was playing bridge. Patient noticed that the pain is worse with movement of her wrist. Pain radiates up into her first 3 digits of the right hand. Denies paresthesias denies fever chills. Denies history of blood clots. Denies other injury.  Past Medical History  Diagnosis Date  . Essential hypertension, malignant   . Pure hypercholesterolemia   . Extrinsic asthma, unspecified     no inhalers  . Osteoarthrosis, unspecified whether generalized or localized, unspecified site   . Osteopenia   . Glaucoma   . DJD (degenerative joint disease)   . Lactose intolerance    Past Surgical History  Procedure Laterality Date  . Fracture surgery      rt lower leg  . Carpal tunnel release  07/25/2011    Procedure: CARPAL TUNNEL RELEASE;  Surgeon: Wyn Forster., MD;  Location: [facility];  Service: Orthopedics;  Laterality: Right;  . Dupuytren contracture release  07/25/2011    Procedure: DUPUYTREN CONTRACTURE RELEASE;  Surgeon: Wyn Forster., MD;  Location: [facility];  Service: Orthopedics;  Laterality: Right;  Resection Right  Long Dupuytrens  . Eye surgery      both cataracts   Family History  Problem Relation Age of Onset  . Arthritis Mother   . Heart disease Father    Social History  Substance Use Topics  . Smoking status: Former Smoker    Quit date: 07/23/1981  . Smokeless tobacco: None  . Alcohol Use: Yes   OB History    No data available     Review of Systems  Constitutional: Negative for fever and chills.  HENT: Negative for congestion and rhinorrhea.   Eyes: Negative for redness and visual disturbance.  Respiratory: Negative for shortness of breath and wheezing.   Cardiovascular: Negative for chest pain and palpitations.  Gastrointestinal: Negative for nausea, vomiting and abdominal pain.  Genitourinary: Negative for dysuria and urgency.  Musculoskeletal: Positive for myalgias and arthralgias.  Skin: Negative for pallor and wound.  Neurological: Negative for dizziness and headaches.      Allergies  Review of patient's allergies indicates no known allergies.  Home Medications   Prior to Admission medications   Medication Sig Start Date End Date Taking? Authorizing Provider  acidophilus (RISAQUAD) CAPS capsule Take 1 capsule by mouth daily.   Yes Historical Provider, MD  amLODipine (NORVASC) 5 MG tablet Take 5 mg by mouth daily.   Yes Historical Provider, MD  benazepril (LOTENSIN) 10 MG tablet Take 10 mg by mouth daily.   Yes Historical Provider, MD  ibuprofen (ADVIL,MOTRIN) 200 MG tablet Take 400 mg by mouth every 6 (six) hours as needed for headache  or moderate pain.   Yes Historical Provider, MD  magnesium oxide (MAG-OX) 400 MG tablet Take 400 mg by mouth daily.   Yes Historical Provider, MD  metoprolol (LOPRESSOR) 50 MG tablet Take 1 tablet by mouth 2 (two) times daily. 02/12/15  Yes Historical Provider, MD  terbinafine (LAMISIL) 250 MG tablet Take 250 mg by mouth daily. APPROXIMATE COURSE DATES 03/05/15-03/26/15; PT IS UNABLE TO CONFIRM EXACT DATES OF DOSE 03/05/15  Yes Historical  Provider, MD  timolol (TIMOPTIC) 0.5 % ophthalmic solution Place 1 drop into both eyes 2 (two) times daily. 02/09/15  Yes Historical Provider, MD  travoprost, benzalkonium, (TRAVATAN Z) 0.004 % ophthalmic solution Place 1 drop into both eyes at bedtime.    Yes Historical Provider, MD   BP 150/87 mmHg  Pulse 60  Temp(Src) 98.4 F (36.9 C) (Oral)  Resp 18  SpO2 100% Physical Exam  Constitutional: She is oriented to person, place, and time. She appears well-developed and well-nourished. No distress.  HENT:  Head: Normocephalic and atraumatic.  Eyes: EOM are normal. Pupils are equal, round, and reactive to light.  Neck: Normal range of motion. Neck supple.  Cardiovascular: Normal rate and regular rhythm.  Exam reveals no gallop and no friction rub.   No murmur heard. Pulmonary/Chest: Effort normal. She has no wheezes. She has no rales.  Abdominal: Soft. She exhibits no distension. There is no tenderness. There is no rebound and no guarding.  Musculoskeletal: She exhibits no edema or tenderness.       Hands: Multiple fingers with trigger finger.  Neurological: She is alert and oriented to person, place, and time.  Skin: Skin is warm and dry. She is not diaphoretic.  Psychiatric: She has a normal mood and affect. Her behavior is normal.  Nursing note and vitals reviewed.   ED Course  Procedures (including critical care time) Labs Review Labs Reviewed - No data to display  Imaging Review Dg Wrist Complete Right  03/23/2015  CLINICAL DATA:  75 year old female with generalized right wrist pain since last night with no known injury. Symptoms increase with motion. Initial encounter. EXAM: RIGHT WRIST - COMPLETE 3+ VIEW COMPARISON:  None. FINDINGS: Osteopenia. Radiocarpal joint space loss and chondrocalcinosis. Distal right radius and ulna appear intact. Mild scapholunate interval enlargement. The scaphoid appears intact. No acute carpal bone fracture identified. Metacarpals appear intact.  IMPRESSION: Right wrist degeneration including chondrocalcinosis which can be seen in the setting of calcium pyrophosphate deposition disease. No acute osseous abnormality identified. Electronically Signed   By: Odessa Fleming M.D.   On: 03/23/2015 11:00   I have personally reviewed and evaluated these images and lab results as part of my medical decision-making.   EKG Interpretation None      MDM   Final diagnoses:  Right wrist pain    75 yo F with a chief complaint of right hand pain. Patient has pain along the median canal. Concern for carpal tunnel syndrome. Patient however has had a release in the past. Will treat conservatively at this point with NSAIDs and splinting. Have her follow-up with her hand surgeon. Doubt septic arthritis at this time.    I have discussed the diagnosis/risks/treatment options with the patient and believe the pt to be eligible for discharge home to follow-up with Hand surgery, PCP. We also discussed returning to the ED immediately if new or worsening sx occur. We discussed the sx which are most concerning (e.g., sudden worsening pain, fever, inability to tolerate by mouth) that necessitate immediate return.  Medications administered to the patient during their visit and any new prescriptions provided to the patient are listed below.  Medications given during this visit Medications  ibuprofen (ADVIL,MOTRIN) tablet 600 mg (600 mg Oral Given 03/23/15 1144)    Discharge Medication List as of 03/23/2015 11:13 AM      The patient appears reasonably screen and/or stabilized for discharge and I doubt any other medical condition or other [facility] requiring further screening, evaluation, or treatment in the ED at this time prior to discharge.      Melene Plan, DO 03/23/15 1500

## 2015-03-26 DIAGNOSIS — M1120 Other chondrocalcinosis, unspecified site: Secondary | ICD-10-CM | POA: Insufficient documentation

## 2016-09-06 ENCOUNTER — Encounter: Payer: Self-pay | Admitting: Neurology

## 2016-12-13 ENCOUNTER — Ambulatory Visit (INDEPENDENT_AMBULATORY_CARE_PROVIDER_SITE_OTHER): Payer: Medicare HMO | Admitting: Neurology

## 2016-12-13 ENCOUNTER — Encounter: Payer: Self-pay | Admitting: Neurology

## 2016-12-13 ENCOUNTER — Other Ambulatory Visit (INDEPENDENT_AMBULATORY_CARE_PROVIDER_SITE_OTHER): Payer: Medicare HMO

## 2016-12-13 VITALS — BP 104/70 | HR 58 | Ht 60.0 in | Wt 127.4 lb

## 2016-12-13 DIAGNOSIS — M6281 Muscle weakness (generalized): Secondary | ICD-10-CM

## 2016-12-13 DIAGNOSIS — R292 Abnormal reflex: Secondary | ICD-10-CM

## 2016-12-13 DIAGNOSIS — R261 Paralytic gait: Secondary | ICD-10-CM

## 2016-12-13 DIAGNOSIS — H519 Unspecified disorder of binocular movement: Secondary | ICD-10-CM

## 2016-12-13 LAB — VITAMIN B12: Vitamin B-12: 408 pg/mL (ref 211–911)

## 2016-12-13 LAB — FOLATE: FOLATE: 11.3 ng/mL (ref >5.9)

## 2016-12-13 LAB — TSH: TSH: 1.01 u[IU]/mL (ref 0.35–4.50)

## 2016-12-13 MED ORDER — BACLOFEN 10 MG PO TABS: 10.0000 mg | ORAL_TABLET | Freq: Three times a day (TID) | ORAL | 5 refills | Status: DC

## 2016-12-13 NOTE — Patient Instructions (Addendum)
Start Baclofen 10 mg tablets    Morning       Afternoon        Evening  Day 1-5                                           1/2 tab              Day 6-10 1/2 tab                               1/2 tab              Day 11-15 1/2 tab         1/2 tab           1/2  tab                Day 16-20 1/2 tab           1/2 tab         1 tab                Day 21- 25 1 tab             1/2 tab           1 tab           Continue  1 tab  1 tab       1 tab        MRI thoracic spine without contast MRI brain without contrast Check labs  Return to clinic in 3 months

## 2016-12-13 NOTE — Progress Notes (Signed)
[facility] Clinic Note - Initial Visit   Date: 12/13/16  SHANDREA LUSK MRN: [id number] DOB: 1940-05-21   Dear Dr. August Saucer:  Thank you for your kind referral of Tniya Bowditch [facility] for consultation of leg weaknes. Although her history is well known to you, please allow [place] to reiterate it for the purpose of our medical record. The patient was accompanied to the clinic by self.   History of Present Illness: STARLETTE THUROW is a 76 y.o. right-handed Caucasian female with hypertension, glaucoma, and OA presenting for evaluation of bilateral leg weakness.  Starting around 2015, she began having exertional weakness and fatigue of the lower legs.  Within walking about 10-15 minutes, she begins to have sensation of heaviness of the legs. She has no symptoms, if she is using hiking sticks and walking.  There is no painful sensation of the legs or numbness/tingling of the legs.  She has some leg cramps and low back pain in the morning which is improved with stretching.  She has imbalance and has been walking with a cane for the past months.  Her right foot tends to drag and she has suffered several falls with this.  She completed physical therapy which has helped right foot weakness and balance.  Her physical therapist suggested that she see neurology for evaluation. MRI lumbar spine from 2016 shows mild right foraminal stenosis at L4-5 and L5-S1.    She was drinking 2-3 glasses of wine nightly for about 6 months, but stopped this in the early summer of 2018.  She typically drinks 1-2 bottles of wine over the weekend.  Out-side paper records, electronic medical record, and images have been reviewed where available and summarized as:  MRI lumbar spine 08/24/2014: 1. No high-grade central canal or foraminal stenosis. Mild right foraminal stenosis at L4-L5 and L5-S1. 2. Disc space height loss with chronic appearing degenerative endplate changes at L5-S1. 3. Partially demonstrated  T2 hyperintense structures within the posterior right liver and within the bilateral kidneys, not fully evaluated, consider abdominal ultrasound.  Past Medical History:  Diagnosis Date  . DJD (degenerative joint disease)   . Essential hypertension, malignant   . Extrinsic asthma, unspecified    no inhalers  . Glaucoma   . Lactose intolerance   . Osteoarthrosis, unspecified whether generalized or localized, unspecified site   . Osteopenia   . Pure hypercholesterolemia     Past Surgical History:  Procedure Laterality Date  . CARPAL TUNNEL RELEASE  07/25/2011   Procedure: CARPAL TUNNEL RELEASE;  Surgeon: Wyn Forster., MD;  Location: [facility];  Service: Orthopedics;  Laterality: Right;  . 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   Procedure: DUPUYTREN CONTRACTURE RELEASE;  Surgeon: Wyn Forster., MD;  Location: [facility];  Service: Orthopedics;  Laterality: Right;  Resection Right Long Dupuytrens  . EYE SURGERY     both cataracts  . FRACTURE SURGERY     rt lower leg     Medications:  Outpatient Encounter Prescriptions as of 12/13/2016  Medication Sig  . amLODipine (NORVASC) 5 MG tablet Take 5 mg by mouth daily.  . benazepril (LOTENSIN) 10 MG tablet Take 10 mg by mouth daily.  Marland Kitchen ibuprofen (ADVIL,MOTRIN) 200 MG tablet Take 400 mg by mouth every 6 (six) hours as needed for headache or moderate pain.  . magnesium oxide (MAG-OX) 400 MG tablet Take 400 mg by mouth daily.  . metoprolol (LOPRESSOR) 50 MG tablet Take 1 tablet by  mouth 2 (two) times daily.  . timolol (TIMOPTIC) 0.5 % ophthalmic solution Place 1 drop into both eyes 2 (two) times daily.  . travoprost, benzalkonium, (TRAVATAN Z) 0.004 % ophthalmic solution Place 1 drop into both eyes at bedtime.   . baclofen (LIORESAL) 10 MG tablet Take 1 tablet (10 mg total) by mouth 3 (three) times daily.  . [DISCONTINUED] acidophilus (RISAQUAD) CAPS capsule Take 1 capsule by mouth daily.  .  [DISCONTINUED] terbinafine (LAMISIL) 250 MG tablet Take 250 mg by mouth daily. APPROXIMATE COURSE DATES 03/05/15-03/26/15; PT IS UNABLE TO CONFIRM EXACT DATES OF DOSE   No facility-administered encounter medications on file as of 12/13/2016.      Allergies: No Known Allergies  Family History: Family History  Problem Relation Age of Onset  . Arthritis Mother   . Cancer Mother   . Heart disease Father   . Asthma Sister     Social History: Social History  Substance Use Topics  . Smoking status: Former Smoker    Quit date: 07/23/1981  . Smokeless tobacco: Never Used  . Alcohol use Yes     Comment: Drinks wine on the weekends 3 glasses   Social History   Social History Narrative   Lives alone in a one story home.  No children.  Has one cat.     Retired Programmer, systems.     Education: PhD.    Review of Systems:  CONSTITUTIONAL: No fevers, chills, night sweats, or weight loss.   EYES: No visual changes or eye pain ENT: No hearing changes.  No history of nose bleeds.   RESPIRATORY: No cough, wheezing and shortness of breath.   CARDIOVASCULAR: Negative for chest pain, and palpitations.   GI: Negative for abdominal discomfort, blood in stools or black stools.  No recent change in bowel habits.   GU:  No history of incontinence.   MUSCLOSKELETAL: +history of joint pain or swelling.  No myalgias.   SKIN: Negative for lesions, rash, and itching.   HEMATOLOGY/ONCOLOGY: Negative for prolonged bleeding, bruising easily, and swollen nodes.  No history of cancer.   ENDOCRINE: Negative for cold or heat intolerance, polydipsia or goiter.   PSYCH:  No depression or anxiety symptoms.   NEURO: As Above.   Vital Signs:  BP 104/70   Pulse (!) 58   Ht 5' (1.524 m)   Wt 127 lb 6 oz (57.8 kg)   SpO2 98%   BMI 24.88 kg/m    General Medical Exam:   General:  Well appearing, comfortable.   Eyes/ENT: see cranial nerve examination.   Neck: No masses appreciated.  Full range of motion without  tenderness.  No carotid bruits. Respiratory:  Clear to auscultation, good air entry bilaterally.   Cardiac:  Regular rate and rhythm, no murmur.   Extremities:  No deformities, edema, or skin discoloration.  Skin:  No rashes or lesions.  Neurological Exam: MENTAL STATUS including orientation to time, place, person, recent and remote memory, attention span and concentration, language, and fund of knowledge is normal.  Speech is not dysarthric.  Normal affect.   CRANIAL NERVES: II:  No visual field defects.  Unremarkable fundi.   III-IV-VI: Pupils equal round and reactive to light.  Extraocular movements are ~50% restricted horizontally and vertically.  Eyes are conjugate. There is end-point nystagmus bilaterally.  No ptosis.   V:  Normal facial sensation. Jaw jerk is absent.  VII:  Normal facial symmetry and movements.  Pathological facial reflexes are absent.  VIII:  Normal  hearing and vestibular function.   IX-X:  Normal palatal movement.   XI:  Normal shoulder shrug and head rotation.   XII:  Normal tongue strength and range of motion, no deviation or fasciculation.  MOTOR:  No atrophy, fasciculations or abnormal movements.  No pronator drift.     Right Upper Extremity:    Left Upper Extremity:    Deltoid  5/5   Deltoid  5/5   Biceps  5/5   Biceps  5/5   Triceps  5/5   Triceps  5/5   Wrist extensors  5/5   Wrist extensors  5/5   Wrist flexors  5/5   Wrist flexors  5/5   Finger extensors  5/5   Finger extensors  5/5   Finger flexors  5/5   Finger flexors  5/5   Dorsal interossei  5/5   Dorsal interossei  5/5   Abductor pollicis  5/5   Abductor pollicis  5/5   Tone (Ashworth scale)  0  Tone (Ashworth scale)  0   Right Lower Extremity:    Left Lower Extremity:    Hip flexors  5/5   Hip flexors  5/5   Hip extensors  5/5   Hip extensors  5/5   Knee flexors  5/5   Knee flexors  5/5   Knee extensors  5/5   Knee extensors  5/5   Dorsiflexors  5/5   Dorsiflexors  5/5   Plantarflexors   5/5   Plantarflexors  5/5   Toe extensors  5/5   Toe extensors  5/5   Toe flexors  5/5   Toe flexors  5/5   Tone (Ashworth scale)  1  Tone (Ashworth scale)  1   MSRs:  Right                                                                 Left brachioradialis 2+  brachioradialis 2+  biceps 2+  biceps 2+  triceps 2+  triceps 2+  patellar 3+  patellar 3+  ankle jerk 2+  ankle jerk 2+  Hoffman no  Hoffman no  plantar response down  plantar response down   SENSORY:  Normal and symmetric perception of light touch, pinprick, vibration, and proprioception.  Romberg's sign absent.   COORDINATION/GAIT: Normal finger-to- nose-finger and heel-to-shin.  Finger tapping is intact.  Bilateral toe and heel tapping is slowed.  Unable to rise from a chair without using arms.  Gait appears spastic, especially RLE.   She is able to stand on heels and toes, but unable to walk.  Unable to performed tandem gait.    IMPRESSION: Ms. Alcide GoodnessDoost is a 76 year-old female referred for evaluation of bilateral leg fatigue since 2015.   Her neurological exam shows bilateral leg spasticity, hyperreflexia, bradykinesia of the legs, and restricted eye movements both vertically and horizontally.  She does not have any pathological facial reflexes and there are no upper motor neuron findings in her upper extremities.  Her previous evaluation has included MRI lumbar spine from 2016 shows mild right foraminal stenosis at L4-5 and L5-S1, no canal stenosis.  To further evaluate.  Gait disturbance and exam findings, I recommend MRI brain and cervical spine without contrast, specifically looking for compressive myelopathy and/or focal  intracranial atrophy.  Labs for myelopathy will be checked as noted below.  If there is no compressive myelopathy on imaging, Parkinson's plus syndrome, such as progressive supranuclear palsy, needs to be considered.   PLAN/RECOMMENDATIONS:  1.  MRI brain and thoracic spine wo contrast 2.  Check vitamin B12,  vitamin B1, copper, zinc, vitamin E, TSH, folate 3.  Start baclofen 5mg  at bedtime and titrate to 10mg  TID 4.  If no benefit with muscle relaxants, she will be offered a trial of Sinemet  Return to clinic in 3 months.   Thank you for allowing me to participate in patient's care.  If I can answer any additional questions, I would be pleased to do so.    Sincerely,    Jermichael Belmares K. Allena Katz, DO

## 2016-12-19 ENCOUNTER — Telehealth: Payer: Self-pay | Admitting: *Deleted

## 2016-12-19 NOTE — Telephone Encounter (Signed)
Labs are active and in progress.

## 2016-12-19 NOTE — Telephone Encounter (Signed)
-----   Message from [place] Chardonika K Patel, DO sent at 12/18/2016  8:10 AM EST ----- Please f/u on his vitamin B1 and E. Thanks.

## 2016-12-20 LAB — VITAMIN E
GAMMA-TOCOPHEROL (VIT E): 2.8 mg/L (ref <=4.3)
Vitamin E (Alpha Tocopherol): 27.7 mg/L — ABNORMAL HIGH (ref 5.7–19.9)

## 2016-12-20 LAB — TIQ-NTM

## 2016-12-20 LAB — VITAMIN B1: VITAMIN B1 (THIAMINE): 12 nmol/L (ref 8–30)

## 2016-12-20 LAB — COPPER, SERUM: Copper: 128 ug/dL (ref 70–175)

## 2016-12-20 LAB — ZINC: ZINC: 83 ug/dL (ref 60–130)

## 2016-12-22 ENCOUNTER — Telehealth: Payer: Self-pay | Admitting: *Deleted

## 2016-12-22 NOTE — Telephone Encounter (Signed)
-----   Message from [place] Chardonika K Patel, DO sent at 12/22/2016  2:51 PM EST ----- Please notify patient lab are within normal limits.  Thank you.

## 2016-12-22 NOTE — Telephone Encounter (Signed)
Patient notified labs are normal

## 2016-12-26 ENCOUNTER — Telehealth: Payer: Self-pay | Admitting: Neurology

## 2016-12-26 NOTE — Telephone Encounter (Signed)
PA # [id number] given to Marylene Landngela.  Valid until 01-25-17.

## 2016-12-26 NOTE — Telephone Encounter (Signed)
Maria Kelley was calling to follow up on this patient. She is coming into their office this afternoon. Please call. Thanks

## 2017-06-21 ENCOUNTER — Other Ambulatory Visit: Payer: Self-pay | Admitting: Internal Medicine

## 2017-06-21 DIAGNOSIS — R531 Weakness: Secondary | ICD-10-CM

## 2017-06-21 DIAGNOSIS — R292 Abnormal reflex: Secondary | ICD-10-CM

## 2017-07-03 ENCOUNTER — Other Ambulatory Visit: Payer: Medicare HMO

## 2017-08-20 ENCOUNTER — Other Ambulatory Visit: Payer: Medicare HMO

## 2017-08-24 ENCOUNTER — Ambulatory Visit
Admission: RE | Admit: 2017-08-24 | Discharge: 2017-08-24 | Disposition: A | Discharge status: Home / Self Care | Payer: Medicare HMO | Source: Ambulatory Visit | Attending: Internal Medicine | Admitting: Internal Medicine

## 2017-08-24 DIAGNOSIS — R292 Abnormal reflex: Secondary | ICD-10-CM

## 2017-08-24 DIAGNOSIS — R531 Weakness: Secondary | ICD-10-CM

## 2017-08-24 MED ORDER — GADOBENATE DIMEGLUMINE 529 MG/ML IV SOLN
10.0000 mL | Freq: Once | INTRAVENOUS | Status: AC | PRN
  Administered 2017-08-24: 10 mL via INTRAVENOUS

## 2017-10-11 ENCOUNTER — Ambulatory Visit: Payer: Medicare HMO | Admitting: Neurology

## 2017-10-11 ENCOUNTER — Encounter: Payer: Self-pay | Admitting: Neurology

## 2017-10-11 VITALS — BP 130/84 | HR 61 | Ht 60.0 in | Wt 127.2 lb

## 2017-10-11 DIAGNOSIS — R261 Paralytic gait: Secondary | ICD-10-CM | POA: Diagnosis not present

## 2017-10-11 DIAGNOSIS — H519 Unspecified disorder of binocular movement: Secondary | ICD-10-CM | POA: Diagnosis not present

## 2017-10-11 DIAGNOSIS — R292 Abnormal reflex: Secondary | ICD-10-CM | POA: Diagnosis not present

## 2017-10-11 NOTE — Progress Notes (Signed)
Follow-up Visit   Date: 10/11/17    Maria Kelley MRN: [id number] DOB: May 08, 1940   Interim History: Maria Kelley is a 77 y.o.  right-handed Caucasian female with hypertension, glaucoma, and OA returning to the clinic for follow-up of spastic gait.  The patient was accompanied to the clinic by self.  History of present illness: Starting around 2015, she began having exertional weakness and fatigue of the lower legs.  Within walking about 10-15 minutes, she begins to have sensation of heaviness of the legs. She has no symptoms, if she is using hiking sticks and walking.  There is no painful sensation of the legs or numbness/tingling of the legs.  She has some leg cramps and low back pain in the morning which is improved with stretching.  She has imbalance and has been walking with a cane for the past months.  Her right foot tends to drag and she has suffered several falls with this.  She completed physical therapy which has helped right foot weakness and balance.  Her physical therapist suggested that she see neurology for evaluation. MRI lumbar spine from 2016 shows mild right foraminal stenosis at L4-5 and L5-S1.    She was drinking 2-3 glasses of wine nightly for about 6 months, but stopped this in the early summer of 2018.  She typically drinks 1-2 bottles of wine over the weekend.  UPDATE 10/11/2017:   She is here today to discuss the results of her MRI brain and cervical spine which she had in July.  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  She denies chronic neck pain, paresthesias of the hands, or weakness.  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  She denies any history of acute stroke or TIA.  She continues to have stiffness of the legs and problems with coordinating movements, as well as imbalance.  She uses a cane and has not suffered  any recent falls.   Medications:  Current Outpatient Medications on File Prior to Visit  Medication Sig Dispense Refill  . amLODipine (NORVASC) 5 MG tablet Take 5 mg by mouth daily.    . benazepril (LOTENSIN) 10 MG tablet Take 10 mg by mouth daily.    Marland Kitchen. ibuprofen (ADVIL,MOTRIN) 200 MG tablet Take 400 mg by mouth every 6 (six) hours as needed for headache or moderate pain.    . metoprolol (LOPRESSOR) 50 MG tablet Take 1 tablet by mouth 2 (two) times daily.    . timolol (TIMOPTIC) 0.5 % ophthalmic solution Place 1 drop into both eyes 2 (two) times daily.  3  . travoprost, benzalkonium, (TRAVATAN Z) 0.004 % ophthalmic solution Place 1 drop into both eyes at bedtime.     . baclofen (LIORESAL) 10 MG tablet Take 1 tablet (10 mg total) by mouth 3 (three) times daily. (Patient not taking: Reported on 10/11/2017) 90 each 5  . magnesium oxide (MAG-OX) 400 MG tablet Take 400 mg by mouth daily.     No current facility-administered medications on file prior to visit.     Allergies: No Known Allergies  Review of Systems:  CONSTITUTIONAL: No fevers, chills, night sweats, or weight loss.  EYES: No visual changes or eye pain ENT: No hearing changes.  No history of nose bleeds.   RESPIRATORY: No cough, wheezing and shortness of breath.   CARDIOVASCULAR: Negative for chest pain, and palpitations.  GI: Negative for abdominal discomfort, blood in stools or black stools.  No recent change in bowel habits.   GU:  No history of incontinence.   MUSCLOSKELETAL: No history of joint pain or swelling.  No myalgias.   SKIN: Negative for lesions, rash, and itching.   ENDOCRINE: Negative for cold or heat intolerance, polydipsia or goiter.   PSYCH:  No depression or anxiety symptoms.   NEURO: As Above.   Vital Signs:  BP 130/84   Pulse 61   Ht 5' (1.524 m)   Wt 127 lb 4 oz (57.7 kg)   SpO2 98%   BMI 24.85 kg/m   General Medical Exam:   General:  Well appearing, comfortable  Eyes/ENT: see cranial nerve  examination.   Neck: No masses appreciated.  Full range of motion without tenderness.  No carotid bruits. Respiratory:  Clear to auscultation, good air entry bilaterally.   Cardiac:  Regular rate and rhythm, no murmur.   Ext:  No edema   Neurological Exam: MENTAL STATUS including orientation to time, place, person, recent and remote memory, attention span and concentration, language, and fund of knowledge is normal.  Speech is not dysarthric.  CRANIAL NERVES: No visual field defects.  Pupils equal round and reactive to light.  Severely restricted lateral > vertical eye movements.  There is mild end-point nonfatigable nystagmus bilaterally. No ptosis.  Face is symmetric. Palate elevates symmetrically.  Tongue is midline.  MOTOR:  Motor strength is 5/5 in all extremities.  Tone is 1+ in the legs bilaterally, normal in the arms.  There is no tremor or abnormal movements.  MSRs:  Reflexes are 2+/4 in the arms and 3+/4 in the legs - patella and Achilles bilaterally.   SENSORY:  Intact to vibration throughout.  COORDINATION/GAIT:  Normal finger-to- nose-finger and heel-to-shin.  Bilateral toe and heel tapping is slowed.  Gait is ataxic and spastic, assisted with cane.  Data: MRI lumbar spine 08/24/2014: 1. No high-grade central canal or foraminal stenosis. Mild right foraminal stenosis at L4-L5 and L5-S1. 2. Disc space height loss with chronic appearing degenerative endplate changes at L5-S1. 3. Partially demonstrated T2 hyperintense structures within the posterior right liver and within the bilateral kidneys, not fully evaluated, consider abdominal ultrasound.  MRI cervical spine wwo contrast 08/24/2017: Normal appearing cervical cord. Abnormal signal in the left cerebellar hemisphere. Please see report of separately dictated brain MRI this same day. Disc osteophyte complex at C4-5 effaces the ventral thecal sac. Moderately severe to severe bilateral foraminal narrowing at this level is worse  on the left. Moderate to moderately severe right and mild to moderate left foraminal narrowing C5-6. Moderate to moderately severe bilateral foraminal narrowing at C6-7 is worse on the left.  MRI brain wwo contrast 08/24/2017: Hyperintensity left lateral cerebellum most compatible with chronic infarct.  Generalized atrophy and mild chronic microvascular ischemic change in the white matter. No acute abnormality.  Labs 12/13/2017:  Vitamin B1 12, vitamin B12 408, copper 128, folate 11.3, zinc 83, vitamin E 27.7   IMPRESSION/PLAN: Spastic gait - there is no compressive myelopathy on MRI cervical spine or lumbar spine which was personally viewed with patient.  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, but this would not be expected to give left sided [facility] findings  - Proceed with MRI thoracic spine wwo contrast   - If there is no structural pathology on thoracic imaging, need to consider neurodegenerative condition as a possibility especially with  her gaze restriction  - Recommend that she start baclofen 5mg  at bedtime and titrate as per schedule provided  - Start using a rollator for gait assistance as cane is inadequate  - Continue leg stretches exercises with PT   She had many questions which were answered to the best of my ability  Further recommendations pending results   The duration of this appointment visit was 45 minutes of face-to-face time with the patient.  Greater than 50% of this time was spent in counseling, explanation of diagnosis, planning of further management, and coordination of care.   Thank you for allowing me to participate in patient's care.  If I can answer any additional questions, I would be pleased to do so.    Sincerely,    Dorothie Wah K. Allena Katz, DO

## 2017-10-11 NOTE — Patient Instructions (Addendum)
Start Baclofen 10 mg tablets    Morning       Afternoon        Evening  Day 1-5 1/2 tab                                1/2 tab              Day 6-10 1/2 tab         1/2 tab            1/2 tab              Day 11-15 1/2 tab         1/2 tab            1 tab                Day 16-20 1 tab            1/2 tab            1 tab                Continue 1 tab             1 tab              1 tab                 MRI thoracic spine wwo contrast  Start using a rollator for your balance

## 2017-11-08 ENCOUNTER — Ambulatory Visit
Admission: RE | Admit: 2017-11-08 | Discharge: 2017-11-08 | Disposition: A | Discharge status: Home / Self Care | Payer: Medicare HMO | Source: Ambulatory Visit | Attending: Neurology | Admitting: Neurology

## 2017-11-08 DIAGNOSIS — R292 Abnormal reflex: Secondary | ICD-10-CM

## 2017-11-08 DIAGNOSIS — R261 Paralytic gait: Secondary | ICD-10-CM

## 2017-11-08 DIAGNOSIS — H519 Unspecified disorder of binocular movement: Secondary | ICD-10-CM

## 2017-11-08 MED ORDER — GADOBENATE DIMEGLUMINE 529 MG/ML IV SOLN
10.0000 mL | Freq: Once | INTRAVENOUS | Status: AC | PRN
  Administered 2017-11-08: 10 mL via INTRAVENOUS

## 2017-11-09 ENCOUNTER — Telehealth: Payer: Self-pay | Admitting: *Deleted

## 2017-11-09 NOTE — Telephone Encounter (Signed)
-----   Message from Glendale Chard, DO sent at 11/09/2017  8:50 AM EDT ----- Please inform patient that her MRI thoracic spine does not show any nerve impingement.  I would like to see her back in the office on 10/22 at 3:30p to go over her results and decide the next step - please add to my schedule. Thanks.

## 2017-11-09 NOTE — Telephone Encounter (Signed)
Left message giving patient results and instructions.  Appointment card mailed.

## 2017-12-04 ENCOUNTER — Ambulatory Visit: Payer: Medicare HMO | Admitting: Neurology

## 2017-12-04 ENCOUNTER — Encounter: Payer: Self-pay | Admitting: Neurology

## 2017-12-04 VITALS — BP 150/90 | HR 60 | Ht 60.0 in | Wt 128.0 lb

## 2017-12-04 DIAGNOSIS — R261 Paralytic gait: Secondary | ICD-10-CM | POA: Diagnosis not present

## 2017-12-04 DIAGNOSIS — R251 Tremor, unspecified: Secondary | ICD-10-CM

## 2017-12-04 DIAGNOSIS — H519 Unspecified disorder of binocular movement: Secondary | ICD-10-CM | POA: Diagnosis not present

## 2017-12-04 DIAGNOSIS — R292 Abnormal reflex: Secondary | ICD-10-CM

## 2017-12-04 NOTE — Patient Instructions (Signed)
Continue baclofen 10mg  at bedtime  Continue stretching and balance exercises  We will order DAT scan and call you with more information  I will see you back after the test

## 2017-12-04 NOTE — Progress Notes (Signed)
Follow-up Visit   Date: 12/04/17    Maria Kelley MRN: [id number] DOB: [birth year]   Interim History: Maria Kelley is a 77 y.o.  right-handed Caucasian female with hypertension, glaucoma, and OA returning to the clinic for follow-up of spastic gait.  The patient was accompanied to the clinic by self.  History of present illness: Starting around 2015, she began having exertional weakness and fatigue of the lower legs.  Within walking about 10-15 minutes, she begins to have sensation of heaviness of the legs. She has no symptoms, if she is using hiking sticks and walking.  There is no painful sensation of the legs or numbness/tingling of the legs.  She has some leg cramps and low back pain in the morning which is improved with stretching.  She has imbalance and has been walking with a cane for the past months.  Her right foot tends to drag and she has suffered several falls with this.  She completed physical therapy which has helped right foot weakness and balance.  Her physical therapist suggested that she see neurology for evaluation. MRI lumbar spine from 2016 shows mild right foraminal stenosis at L4-5 and L5-S1.    She was drinking 2-3 glasses of wine nightly for about 6 months, but stopped this in the early summer of 2018.  She typically drinks 1-2 bottles of wine over the weekend.  UPDATE 10/11/2017:   She is here today to discuss the results of her MRI brain and cervical spine which she had in July.  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  She denies chronic neck pain, paresthesias of the hands, or weakness.  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  She denies any history of acute stroke or TIA.  She continues to have stiffness of the legs and problems with coordinating movements, as well as imbalance.   UPDATE 12/04/2017:  She is here for  follow-up visit.  Her MRI thoracic spine did not reveal structural pathology.  She is taking baclofen 10mg  at bedtime and feels that it has loosened her calf muscles and there is less stiffness.  She did not tolerate higher dose due to lightheadedness.  She has not suffered any recent falls and is compliant with using a cane.  She has been doing her own stretching exercises daily.  Medications:  Current Outpatient Medications on File Prior to Visit  Medication Sig Dispense Refill  . amLODipine (NORVASC) 5 MG tablet Take 5 mg by mouth daily.    . baclofen (LIORESAL) 10 MG tablet Take 1 tablet (10 mg total) by mouth 3 (three) times daily. (Patient taking differently: Take 10 mg by mouth at bedtime. ) 90 each 5  . benazepril (LOTENSIN) 10 MG tablet Take 10 mg by mouth daily.    Marland Kitchen ibuprofen (ADVIL,MOTRIN) 200 MG tablet Take 400 mg by mouth every 6 (six) hours as needed for headache or moderate pain.    . magnesium oxide (MAG-OX) 400 MG tablet Take 400 mg by mouth daily.    . metoprolol (LOPRESSOR) 50 MG tablet Take 1 tablet by mouth 2 (two) times daily.    . timolol (TIMOPTIC) 0.5 % ophthalmic solution Place 1 drop into both eyes 2 (two) times daily.  3  . travoprost, benzalkonium, (TRAVATAN Z) 0.004 % ophthalmic solution Place 1 drop into both eyes at bedtime.  No current facility-administered medications on file prior to visit.     Allergies: No Known Allergies  Review of Systems:  CONSTITUTIONAL: No fevers, chills, night sweats, or weight loss.  EYES: No visual changes or eye pain ENT: No hearing changes.  No history of nose bleeds.   RESPIRATORY: No cough, wheezing and shortness of breath.   CARDIOVASCULAR: Negative for chest pain, and palpitations.   GI: Negative for abdominal discomfort, blood in stools or black stools.  No recent change in bowel habits.   GU:  No history of incontinence.   MUSCLOSKELETAL: No history of joint pain or swelling.  No myalgias.   SKIN: Negative for  lesions, rash, and itching.   ENDOCRINE: Negative for cold or heat intolerance, polydipsia or goiter.   PSYCH:  No depression or anxiety symptoms.   NEURO: As Above.   Vital Signs:  BP (!) 150/90   Pulse 60   Ht 5' (1.524 m)   Wt 128 lb (58.1 kg)   SpO2 97%   BMI 25.00 kg/m   General Medical Exam:   General:  Well appearing, comfortable  Eyes/ENT: see cranial nerve examination.   Neck: No carotid bruits. Respiratory:  Clear to auscultation, good air entry bilaterally.   Cardiac:  Regular rate and rhythm, no murmur.   Ext:  No edema   Neurological Exam: MENTAL STATUS including orientation to time, place, person, recent and remote memory, attention span and concentration, language, and fund of knowledge is normal.  Speech is not dysarthric.  CRANIAL NERVES: No visual field defects.  Pupils equal round and reactive to light.  Severely restricted lateral > vertical eye movements.  There is mild end-point nonfatigable nystagmus bilaterally. No ptosis.  Face is symmetric. Palate elevates symmetrically.  Tongue is midline.  No pathological facial reflexes.   MOTOR:  Motor strength is 5/5 in all extremities.  Tone is 1 in the legs bilaterally (improved), normal in the arms.  There is a mild resting head tremor.    MSRs:  Reflexes are 2+/4 in the arms and 3+/4 in the legs - patella and Achilles bilaterally.   COORDINATION/GAIT:  Normal finger-to- nose-finger and heel-to-shin.  Bilateral toe and heel tapping is slowed.  Gait is ataxic and spastic, assisted with cane.  Data: MRI lumbar spine 08/24/2014: 1. No high-grade central canal or foraminal stenosis. Mild right foraminal stenosis at L4-L5 and L5-S1. 2. Disc space height loss with chronic appearing degenerative endplate changes at L5-S1. 3. Partially demonstrated T2 hyperintense structures within the posterior right liver and within the bilateral kidneys, not fully evaluated, consider abdominal ultrasound.  MRI cervical spine wwo  contrast 08/24/2017: Normal appearing cervical cord. Abnormal signal in the left cerebellar hemisphere. Please see report of separately dictated brain MRI this same day. Disc osteophyte complex at C4-5 effaces the ventral thecal sac. Moderately severe to severe bilateral foraminal narrowing at this level is worse on the left. Moderate to moderately severe right and mild to moderate left foraminal narrowing C5-6. Moderate to moderately severe bilateral foraminal narrowing at C6-7 is worse on the left.  MRI brain wwo contrast 08/24/2017: Hyperintensity left lateral cerebellum most compatible with chronic infarct. Generalized atrophy and mild chronic microvascular ischemic change in the white matter. No acute abnormality.  MRI thoracic spine 11/09/2017: 1. No cord impingement or signal abnormality to explain symptoms. 2. Thoracic disc and facet degeneration with exaggerated kyphosis.   Labs 12/13/2017:  Vitamin B1 12, vitamin B12 408, copper 128, folate 11.3, zinc 83, vitamin E  27.7   IMPRESSION/PLAN: Spastic gait - there is no compressive myelopathy on MRI cervical, thoracic, spine or lumbar spine which was personally viewed with patient.  Myelopathy labs are also normal.  With her restricted eye movements, neurodegnerative condition such as PSP is considered.  Less likely HSP in the absence of weakness.  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   Return to clinic after testing    Greater than 50% of this 25 minute visit was spent in counseling, explanation of diagnosis, planning of further management, and coordination of care.    Thank you for allowing me to participate in patient's care.  If I can answer any additional  questions, I would be pleased to do so.    Sincerely,    Sylvester Minton K. Allena Katz, DO

## 2018-01-22 ENCOUNTER — Other Ambulatory Visit: Payer: Self-pay | Admitting: Neurology

## 2018-03-04 ENCOUNTER — Other Ambulatory Visit: Payer: Self-pay | Admitting: *Deleted

## 2018-03-04 DIAGNOSIS — R251 Tremor, unspecified: Secondary | ICD-10-CM

## 2018-03-05 ENCOUNTER — Telehealth: Payer: Self-pay | Admitting: Neurology

## 2018-03-05 NOTE — Telephone Encounter (Signed)
Patient has some questions about a producer that we order for her the codes are [phone] and [phone] it has to do with imaging please call

## 2018-03-05 NOTE — Telephone Encounter (Signed)
DaT scan is looking for dopamine loss which can be seen in certain disease conditions, such as parkinson and atypical states. If she does not want to proceed with testing, we need to cancel it.

## 2018-03-05 NOTE — Telephone Encounter (Signed)
Called patient back and explained to her what the codes were for but she needs more clarification about her dx and the scan.  Please advise.

## 2018-03-06 NOTE — Telephone Encounter (Signed)
Called patient and gave her information per Dr. Allena Katz.  Requested for her to let me know if she would like to cancel the procedure.

## 2018-03-07 ENCOUNTER — Telehealth: Payer: Self-pay | Admitting: Neurology

## 2018-03-07 NOTE — Telephone Encounter (Signed)
Patient calling in about procedure that is costly but she wants to speak with you first. Please call her back at [phone]. Thanks!

## 2018-03-08 NOTE — Telephone Encounter (Signed)
I spoke with patient and she said that the scan is going to cost her about $500.  She has several questions and will send them to [place] via My Chart.

## 2018-03-21 ENCOUNTER — Other Ambulatory Visit: Payer: Self-pay | Admitting: *Deleted

## 2018-03-21 DIAGNOSIS — R251 Tremor, unspecified: Secondary | ICD-10-CM

## 2018-04-12 NOTE — Telephone Encounter (Signed)
Received note from [employer] that insurance denied covered of DAT scan. Paperwork given to [place] to follow up.

## 2019-01-20 ENCOUNTER — Other Ambulatory Visit: Payer: Self-pay

## 2019-01-20 DIAGNOSIS — R202 Paresthesia of skin: Secondary | ICD-10-CM

## 2019-01-30 ENCOUNTER — Other Ambulatory Visit: Payer: Self-pay | Admitting: Neurology

## 2019-02-03 ENCOUNTER — Other Ambulatory Visit: Payer: Self-pay

## 2019-02-04 ENCOUNTER — Ambulatory Visit (INDEPENDENT_AMBULATORY_CARE_PROVIDER_SITE_OTHER): Payer: Medicare HMO | Admitting: Neurology

## 2019-02-04 ENCOUNTER — Other Ambulatory Visit: Payer: Self-pay

## 2019-02-04 DIAGNOSIS — G5603 Carpal tunnel syndrome, bilateral upper limbs: Secondary | ICD-10-CM

## 2019-02-04 DIAGNOSIS — R202 Paresthesia of skin: Secondary | ICD-10-CM

## 2019-02-04 DIAGNOSIS — M5412 Radiculopathy, cervical region: Secondary | ICD-10-CM

## 2019-02-04 NOTE — Procedures (Signed)
[facility]  [address], [address]  [place], [place] [address] Tel: [phone] Fax:  [phone] Test Date:  02/04/2019  Patient: Maria Kelley DOB: Nov 03, 1940 Physician: Nita Sickle, DO  Sex: Female Height: 5\' 0"  Ref Phys: , MD  ID#: Cindee Salt Temp: 34.0C Technician:    Patient Complaints: This is a 78 year old female with history of right CTS release referred for evaluation of bilateral hand tingling, worse on the left.  NCV & EMG Findings: Extensive electrodiagnostic testing of the left upper extremity and additional studies of the right shows:  1. Left median sensory response is absent.  Right median sensory response shows prolonged latency (4.0 ms).  Bilateral ulnar sensory responses are within normal limits. 2. Left median motor response is absent.  Right median motor responses within normal limits.  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  Bilateral ulnar motor responses are within normal limits. 3. Chronic motor axonal changes are seen affecting bilateral abductor pollicis brevis muscles, which is worse on the left.  Additionally, there are neurogenic changes involving the pronator teres and triceps muscles.  Impression: 1. 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   2. Right median neuropathy at or distal to the wrist (mild), consistent with a clinical diagnosis of carpal tunnel syndrome.   3. 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 4. Incidentally, there is evidence of bilateral Martin-Gruber anastomoses, a normal anatomic variant.   ___________________________ 96, DO    Nerve Conduction Studies Anti Sensory Summary Table   Site NR Peak (ms) Norm Peak (ms) P-T  Amp (V) Norm P-T Amp  Left Median Anti Sensory (2nd Digit)  34C  Wrist NR  <3.8  >10  Right Median Anti Sensory (2nd Digit)  34C  Wrist    4.0 <3.8 16.3 >10  Left Ulnar Anti Sensory (5th Digit)  34C  Wrist    2.8 <3.2 26.9 >5  Right Ulnar Anti Sensory (5th Digit)  34C  Wrist    2.4 <3.2 24.9 >5   Motor Summary Table   Site NR Onset (ms) Norm Onset (ms) O-P Amp (mV) Norm O-P Amp Site1 Site2 Delta-0 (ms) Dist (cm) Vel (m/s) Norm Vel (m/s)  Left Median Motor (Abd Poll Brev)  34C  Wrist NR  <4.0  >5 Elbow Wrist  0.0  >50  Elbow NR     Ulnar-wrist crossover Elbow  0.0    Ulnar-wrist crossover    3.9  4.6         Right Median Motor (Abd Poll Brev)  34C  Wrist    3.4 <4.0 6.2 >5 Elbow Wrist 4.6 26.0 57 >50  Elbow    8.0  5.9  Ulnar-wrist crossover Elbow 3.8 0.0    Ulnar-wrist crossover    4.2  4.3         Left Ulnar Motor (Abd Dig Minimi)  34C  Wrist    2.2 <3.1 8.4 >7 B Elbow Wrist 3.3 20.5 62 >50  B Elbow    5.5  7.9  A Elbow B Elbow 1.8 10.0 56 >50  A Elbow    7.3  7.7         Right Ulnar Motor (Abd Dig Minimi)  34C  Wrist    2.2 <3.1 11.0 >7 B  Elbow Wrist 3.0 20.0 67 >50  B Elbow    5.2  10.1  A Elbow B Elbow 1.5 10.0 67 >50  A Elbow    6.7  9.9          EMG   Side Muscle Ins Act Fibs Psw Fasc Number Recrt Dur Dur. Amp Amp. Poly Poly. Comment  Left 1stDorInt Nml Nml Nml Nml Nml Nml Nml Nml Nml Nml Nml Nml N/A  Left Abd Poll Brev Nml Nml Nml Nml [facility] Rapid All 1+ All 1+ All 1+ ATR  Left PronatorTeres Nml Nml Nml Nml 1- Rapid Some 1+ Some 1+ Nml Nml N/A  Left Biceps Nml Nml Nml Nml Nml Nml Nml Nml Nml Nml Nml Nml N/A  Left Triceps Nml Nml Nml Nml 1- Rapid Some 1+ Some 1+ Nml Nml N/A  Left Deltoid Nml Nml Nml Nml Nml Nml Nml Nml Nml Nml Nml Nml N/A  Right 1stDorInt Nml Nml Nml Nml Nml Nml Nml Nml Nml Nml Nml Nml N/A  Right Abd Poll Brev Nml Nml Nml Nml 1- Rapid Few 1+ Few 1+ Nml Nml N/A  Right PronatorTeres Nml Nml Nml Nml 1- Rapid Some 1+ Some 1+ Nml Nml N/A  Right Biceps  Nml Nml Nml Nml Nml Nml Nml Nml Nml Nml Nml Nml N/A  Right Triceps Nml Nml Nml Nml 1- Rapid Some 1+ Some 1+ Nml Nml N/A  Right Deltoid Nml Nml Nml Nml Nml Nml Nml Nml Nml Nml Nml Nml N/A      Waveforms:

## 2019-03-25 ENCOUNTER — Ambulatory Visit: Payer: Medicare HMO

## 2019-03-27 ENCOUNTER — Ambulatory Visit: Payer: Medicare HMO

## 2019-03-27 ENCOUNTER — Other Ambulatory Visit: Payer: Self-pay | Admitting: Orthopedic Surgery

## 2019-04-11 ENCOUNTER — Encounter (HOSPITAL_BASED_OUTPATIENT_CLINIC_OR_DEPARTMENT_OTHER): Payer: Self-pay | Admitting: Orthopedic Surgery

## 2019-04-11 ENCOUNTER — Other Ambulatory Visit: Payer: Self-pay

## 2019-04-14 ENCOUNTER — Encounter (HOSPITAL_BASED_OUTPATIENT_CLINIC_OR_DEPARTMENT_OTHER)
Admission: RE | Admit: 2019-04-14 | Discharge: 2019-04-14 | Disposition: A | Discharge status: Home / Self Care | Payer: Medicare HMO | Source: Ambulatory Visit | Attending: Orthopedic Surgery | Admitting: Orthopedic Surgery

## 2019-04-14 DIAGNOSIS — M8580 Other specified disorders of bone density and structure, unspecified site: Secondary | ICD-10-CM | POA: Diagnosis not present

## 2019-04-14 DIAGNOSIS — G5603 Carpal tunnel syndrome, bilateral upper limbs: Secondary | ICD-10-CM | POA: Diagnosis present

## 2019-04-14 DIAGNOSIS — Z809 Family history of malignant neoplasm, unspecified: Secondary | ICD-10-CM | POA: Diagnosis not present

## 2019-04-14 DIAGNOSIS — Z87891 Personal history of nicotine dependence: Secondary | ICD-10-CM | POA: Diagnosis not present

## 2019-04-14 DIAGNOSIS — E7800 Pure hypercholesterolemia, unspecified: Secondary | ICD-10-CM | POA: Diagnosis not present

## 2019-04-14 DIAGNOSIS — H409 Unspecified glaucoma: Secondary | ICD-10-CM | POA: Diagnosis not present

## 2019-04-14 DIAGNOSIS — E739 Lactose intolerance, unspecified: Secondary | ICD-10-CM | POA: Diagnosis not present

## 2019-04-14 DIAGNOSIS — Z8261 Family history of arthritis: Secondary | ICD-10-CM | POA: Diagnosis not present

## 2019-04-14 DIAGNOSIS — Z825 Family history of asthma and other chronic lower respiratory diseases: Secondary | ICD-10-CM | POA: Diagnosis not present

## 2019-04-14 DIAGNOSIS — Z8249 Family history of ischemic heart disease and other diseases of the circulatory system: Secondary | ICD-10-CM | POA: Diagnosis not present

## 2019-04-14 DIAGNOSIS — J45909 Unspecified asthma, uncomplicated: Secondary | ICD-10-CM | POA: Diagnosis not present

## 2019-04-14 DIAGNOSIS — M1990 Unspecified osteoarthritis, unspecified site: Secondary | ICD-10-CM | POA: Diagnosis not present

## 2019-04-14 DIAGNOSIS — I10 Essential (primary) hypertension: Secondary | ICD-10-CM | POA: Diagnosis not present

## 2019-04-14 NOTE — Progress Notes (Signed)

## 2019-04-14 NOTE — Progress Notes (Signed)
EKG reviewed by Dr. Miller, will proceed with surgery as scheduled.  

## 2019-04-15 ENCOUNTER — Other Ambulatory Visit (HOSPITAL_COMMUNITY)
Admission: RE | Admit: 2019-04-15 | Discharge: 2019-04-15 | Disposition: A | Discharge status: Home / Self Care | Payer: Medicare HMO | Source: Ambulatory Visit | Attending: Orthopedic Surgery | Admitting: Orthopedic Surgery

## 2019-04-15 DIAGNOSIS — Z01812 Encounter for preprocedural laboratory examination: Secondary | ICD-10-CM | POA: Insufficient documentation

## 2019-04-15 DIAGNOSIS — Z20822 Contact with and (suspected) exposure to covid-19: Secondary | ICD-10-CM | POA: Diagnosis not present

## 2019-04-15 LAB — SARS CORONAVIRUS 2 (TAT 6-24 HRS): SARS Coronavirus 2: NEGATIVE

## 2019-04-18 ENCOUNTER — Ambulatory Visit (HOSPITAL_BASED_OUTPATIENT_CLINIC_OR_DEPARTMENT_OTHER)
Admission: RE | Admit: 2019-04-18 | Discharge: 2019-04-18 | Disposition: A | Discharge status: Home / Self Care | Payer: Medicare HMO | Attending: Orthopedic Surgery | Admitting: Orthopedic Surgery

## 2019-04-18 ENCOUNTER — Encounter (HOSPITAL_BASED_OUTPATIENT_CLINIC_OR_DEPARTMENT_OTHER): Payer: Self-pay | Admitting: Orthopedic Surgery

## 2019-04-18 ENCOUNTER — Encounter (HOSPITAL_BASED_OUTPATIENT_CLINIC_OR_DEPARTMENT_OTHER)
Admission: RE | Disposition: A | Discharge status: Home / Self Care | Payer: Self-pay | Source: Home / Self Care | Attending: Orthopedic Surgery

## 2019-04-18 ENCOUNTER — Ambulatory Visit (HOSPITAL_BASED_OUTPATIENT_CLINIC_OR_DEPARTMENT_OTHER): Payer: Medicare HMO | Admitting: Anesthesiology

## 2019-04-18 ENCOUNTER — Other Ambulatory Visit: Payer: Self-pay

## 2019-04-18 DIAGNOSIS — Z8261 Family history of arthritis: Secondary | ICD-10-CM | POA: Insufficient documentation

## 2019-04-18 DIAGNOSIS — Z809 Family history of malignant neoplasm, unspecified: Secondary | ICD-10-CM | POA: Insufficient documentation

## 2019-04-18 DIAGNOSIS — Z8249 Family history of ischemic heart disease and other diseases of the circulatory system: Secondary | ICD-10-CM | POA: Insufficient documentation

## 2019-04-18 DIAGNOSIS — I10 Essential (primary) hypertension: Secondary | ICD-10-CM | POA: Insufficient documentation

## 2019-04-18 DIAGNOSIS — J45909 Unspecified asthma, uncomplicated: Secondary | ICD-10-CM | POA: Insufficient documentation

## 2019-04-18 DIAGNOSIS — M8580 Other specified disorders of bone density and structure, unspecified site: Secondary | ICD-10-CM | POA: Insufficient documentation

## 2019-04-18 DIAGNOSIS — Z825 Family history of asthma and other chronic lower respiratory diseases: Secondary | ICD-10-CM | POA: Insufficient documentation

## 2019-04-18 DIAGNOSIS — H409 Unspecified glaucoma: Secondary | ICD-10-CM | POA: Insufficient documentation

## 2019-04-18 DIAGNOSIS — G5603 Carpal tunnel syndrome, bilateral upper limbs: Secondary | ICD-10-CM | POA: Diagnosis not present

## 2019-04-18 DIAGNOSIS — E739 Lactose intolerance, unspecified: Secondary | ICD-10-CM | POA: Insufficient documentation

## 2019-04-18 DIAGNOSIS — M1990 Unspecified osteoarthritis, unspecified site: Secondary | ICD-10-CM | POA: Insufficient documentation

## 2019-04-18 DIAGNOSIS — E7800 Pure hypercholesterolemia, unspecified: Secondary | ICD-10-CM | POA: Insufficient documentation

## 2019-04-18 DIAGNOSIS — Z87891 Personal history of nicotine dependence: Secondary | ICD-10-CM | POA: Insufficient documentation

## 2019-04-18 HISTORY — PX: CARPAL TUNNEL RELEASE: SHX101

## 2019-04-18 SURGERY — CARPAL TUNNEL RELEASE
Anesthesia: Monitor Anesthesia Care | Site: Hand | Laterality: Left

## 2019-04-18 MED ORDER — CHLORHEXIDINE GLUCONATE 4 % EX LIQD: 60.0000 mL | Freq: Once | CUTANEOUS | Status: DC

## 2019-04-18 MED ORDER — FENTANYL CITRATE (PF) 100 MCG/2ML IJ SOLN
INTRAMUSCULAR | Status: DC | PRN
  Administered 2019-04-18: 25 ug via INTRAVENOUS
  Administered 2019-04-18: 50 ug via INTRAVENOUS
  Administered 2019-04-18: 25 ug via INTRAVENOUS

## 2019-04-18 MED ORDER — ONDANSETRON HCL 4 MG/2ML IJ SOLN
INTRAMUSCULAR | Status: AC
  Filled 2019-04-18: qty 2

## 2019-04-18 MED ORDER — OXYCODONE HCL 5 MG PO TABS: 5.0000 mg | ORAL_TABLET | Freq: Once | ORAL | Status: DC | PRN

## 2019-04-18 MED ORDER — FENTANYL CITRATE (PF) 100 MCG/2ML IJ SOLN: 50.0000 ug | INTRAMUSCULAR | Status: DC | PRN

## 2019-04-18 MED ORDER — LIDOCAINE 2% (20 MG/ML) 5 ML SYRINGE
INTRAMUSCULAR | Status: AC
  Filled 2019-04-18: qty 5

## 2019-04-18 MED ORDER — BUPIVACAINE HCL (PF) 0.25 % IJ SOLN
INTRAMUSCULAR | Status: DC | PRN
  Administered 2019-04-18: 8 mL

## 2019-04-18 MED ORDER — CEFAZOLIN SODIUM 1 G IJ SOLR
INTRAMUSCULAR | Status: AC
  Filled 2019-04-18: qty 60

## 2019-04-18 MED ORDER — FENTANYL CITRATE (PF) 100 MCG/2ML IJ SOLN
INTRAMUSCULAR | Status: AC
  Filled 2019-04-18: qty 2

## 2019-04-18 MED ORDER — LACTATED RINGERS IV SOLN: INTRAVENOUS | Status: DC

## 2019-04-18 MED ORDER — OXYCODONE HCL 5 MG/5ML PO SOLN: 5.0000 mg | Freq: Once | ORAL | Status: DC | PRN

## 2019-04-18 MED ORDER — MIDAZOLAM HCL 2 MG/2ML IJ SOLN: 1.0000 mg | INTRAMUSCULAR | Status: DC | PRN

## 2019-04-18 MED ORDER — TRAMADOL HCL 50 MG PO TABS: 50.0000 mg | ORAL_TABLET | Freq: Four times a day (QID) | ORAL | 0 refills | Status: DC | PRN

## 2019-04-18 MED ORDER — PROPOFOL 500 MG/50ML IV EMUL
INTRAVENOUS | Status: DC | PRN
  Administered 2019-04-18: 25 ug/kg/min via INTRAVENOUS

## 2019-04-18 MED ORDER — FENTANYL CITRATE (PF) 100 MCG/2ML IJ SOLN: 25.0000 ug | INTRAMUSCULAR | Status: DC | PRN

## 2019-04-18 MED ORDER — CEFAZOLIN SODIUM-DEXTROSE 2-4 GM/100ML-% IV SOLN
2.0000 g | INTRAVENOUS | Status: AC
  Administered 2019-04-18: 2 g via INTRAVENOUS

## 2019-04-18 SURGICAL SUPPLY — 35 items
APL PRP STRL LF DISP 70% ISPRP (MISCELLANEOUS) ×1
BLADE SURG 15 STRL LF DISP TIS (BLADE) ×1 IMPLANT
BLADE SURG 15 STRL SS (BLADE) ×3
BNDG CMPR 9X4 STRL LF SNTH (GAUZE/BANDAGES/DRESSINGS) ×1
BNDG COHESIVE 3X5 TAN STRL LF (GAUZE/BANDAGES/DRESSINGS) ×3 IMPLANT
BNDG ESMARK 4X9 LF (GAUZE/BANDAGES/DRESSINGS) ×2 IMPLANT
BNDG GAUZE ELAST 4 BULKY (GAUZE/BANDAGES/DRESSINGS) ×3 IMPLANT
CHLORAPREP W/TINT 26 (MISCELLANEOUS) ×3 IMPLANT
CORD BIPOLAR FORCEPS 12FT (ELECTRODE) ×3 IMPLANT
COVER BACK TABLE 60X90IN (DRAPES) ×3 IMPLANT
COVER MAYO STAND STRL (DRAPES) ×3 IMPLANT
COVER WAND RF STERILE (DRAPES) IMPLANT
CUFF TOURN SGL QUICK 18X4 (TOURNIQUET CUFF) ×3 IMPLANT
DRAPE EXTREMITY T 121X128X90 (DISPOSABLE) ×3 IMPLANT
DRAPE SURG 17X23 STRL (DRAPES) ×3 IMPLANT
DRSG PAD ABDOMINAL 8X10 ST (GAUZE/BANDAGES/DRESSINGS) ×3 IMPLANT
GAUZE SPONGE 4X4 12PLY STRL (GAUZE/BANDAGES/DRESSINGS) ×3 IMPLANT
GAUZE XEROFORM 1X8 LF (GAUZE/BANDAGES/DRESSINGS) ×3 IMPLANT
GLOVE BIOGEL PI IND STRL 8.5 (GLOVE) ×1 IMPLANT
GLOVE BIOGEL PI INDICATOR 8.5 (GLOVE) ×2
GLOVE SURG ORTHO 8.0 STRL STRW (GLOVE) ×3 IMPLANT
GOWN STRL REUS W/ TWL LRG LVL3 (GOWN DISPOSABLE) ×1 IMPLANT
GOWN STRL REUS W/TWL LRG LVL3 (GOWN DISPOSABLE) ×3
GOWN STRL REUS W/TWL XL LVL3 (GOWN DISPOSABLE) ×3 IMPLANT
NDL PRECISIONGLIDE 27X1.5 (NEEDLE) IMPLANT
NEEDLE PRECISIONGLIDE 27X1.5 (NEEDLE) ×3 IMPLANT
NS IRRIG 1000ML POUR BTL (IV SOLUTION) ×3 IMPLANT
PACK BASIN DAY SURGERY FS (CUSTOM PROCEDURE TRAY) ×3 IMPLANT
STOCKINETTE 4X48 STRL (DRAPES) ×3 IMPLANT
SUT ETHILON 4 0 PS 2 18 (SUTURE) ×3 IMPLANT
SUT VICRYL 4-0 PS2 18IN ABS (SUTURE) IMPLANT
SYR BULB 3OZ (MISCELLANEOUS) ×3 IMPLANT
SYR CONTROL 10ML LL (SYRINGE) ×2 IMPLANT
TOWEL GREEN STERILE FF (TOWEL DISPOSABLE) ×3 IMPLANT
UNDERPAD 30X36 HEAVY ABSORB (UNDERPADS AND DIAPERS) ×3 IMPLANT

## 2019-04-18 NOTE — Anesthesia Postprocedure Evaluation (Signed)
Anesthesia Post Note  Patient: Maria Kelley  Procedure(s) Performed: CARPAL TUNNEL RELEASE (Left Hand)     Patient location during evaluation: PACU Anesthesia Type: MAC Level of consciousness: awake Pain management: pain level controlled Vital Signs Assessment: post-procedure vital signs reviewed and stable Respiratory status: spontaneous breathing Cardiovascular status: stable Postop Assessment: no backache Anesthetic complications: no    Last Vitals:  Vitals:   04/18/19 1415 04/18/19 1457  BP: (!) 144/87 (!) 171/96  Pulse: (!) 59 60  Resp: (!) 25 20  Temp:  36.6 C  SpO2: 97% 97%    Last Pain:  Vitals:   04/18/19 1457  TempSrc:   PainSc: 0-No pain                 Bernhardt Riemenschneider

## 2019-04-18 NOTE — Brief Op Note (Signed)
04/18/2019  2:04 PM  PATIENT:  Maria Kelley  79 y.o. female  PRE-OPERATIVE DIAGNOSIS:  LEFT CARPAL TUNNEL SYNDROME  POST-OPERATIVE DIAGNOSIS:  LEFT CARPAL TUNNEL SYNDROME  PROCEDURE:  Procedure(s) with comments: CARPAL TUNNEL RELEASE (Left) - FOREARM BLOCK  SURGEON:  Surgeon(s) and Role:    * Cindee Salt, MD - Primary  PHYSICIAN ASSISTANT:   ASSISTANTS: none   ANESTHESIA:   local, regional and IV sedation  EBL:  1 mL   BLOOD ADMINISTERED:none  DRAINS: none   LOCAL MEDICATIONS USED:  BUPIVICAINE   SPECIMEN:  No Specimen  DISPOSITION OF SPECIMEN:  N/A  COUNTS:  YES  TOURNIQUET:   Total Tourniquet Time Documented: Forearm (Left) - 26 minutes Total: Forearm (Left) - 26 minutes   DICTATION: .Reubin Milan Dictation  PLAN OF CARE: Discharge to home after PACU  PATIENT DISPOSITION:  PACU - hemodynamically stable.

## 2019-04-18 NOTE — Discharge Instructions (Addendum)

## 2019-04-18 NOTE — Anesthesia Preprocedure Evaluation (Signed)
Anesthesia Evaluation  Patient identified by MRN, date of birth, ID band Patient awake    Reviewed: Allergy & Precautions, NPO status , Patient's Chart, lab work & pertinent test results, reviewed documented beta blocker date and time   Airway Mallampati: II  TM Distance: >3 FB Neck ROM: Full    Dental no notable dental hx. (+) Teeth Intact, Caps   Pulmonary asthma , former smoker,    Pulmonary exam normal breath sounds clear to auscultation       Cardiovascular hypertension, Pt. on medications and Pt. on home beta blockers Normal cardiovascular exam Rhythm:Regular Rate:Normal     Neuro/Psych Glaucoma Left CTS negative neurological ROS  negative psych ROS   GI/Hepatic negative GI ROS, Neg liver ROS,   Endo/Other  Hyperlipidemia  Renal/GU negative Renal ROS  negative genitourinary   Musculoskeletal  (+) Arthritis , Osteoarthritis,  Left Carpal Tunnel syndrome   Abdominal   Peds  Hematology negative hematology ROS (+)   Anesthesia Other Findings   Reproductive/Obstetrics                             Anesthesia Physical Anesthesia Plan  ASA: II  Anesthesia Plan: Bier Block and MAC and Bier Block-LIDOCAINE ONLY   Post-op Pain Management:    Induction:   PONV Risk Score and Plan: 2 and Propofol infusion, Ondansetron and Treatment may vary due to age or medical condition  Airway Management Planned: Natural Airway and Nasal Cannula  Additional Equipment:   Intra-op Plan:   Post-operative Plan:   Informed Consent: I have reviewed the patients History and Physical, chart, labs and discussed the procedure including the risks, benefits and alternatives for the proposed anesthesia with the patient or authorized representative who has indicated his/her understanding and acceptance.     Dental advisory given  Plan Discussed with: CRNA, Anesthesiologist and Surgeon  Anesthesia Plan  Comments:         Anesthesia Quick Evaluation

## 2019-04-18 NOTE — H&P (Signed)
Maria Kelley is an 79 y.o. female.   Chief Complaint:numbness left handHPI: Maria Kelley is an 79 year old right-hand-dominant female referred by Dr. Marlou Sa for consultation regarding numbness tingling in her left hand thumb to ring fingers of the been going on for approximately 4 months. She has no history of injury to the hand or to the neck. Does however have neck pain. She is awakened occasionally at night. She states nothing makes it better or worse. She states the numbness and tingling is relatively constant.   Her right side is more intense than the left. He was referred for nerve conductions at that time. She was seen by Dr. Posey Pronto had this revealed bilateral changes in the median nerve with no response to the motor component on either side no response to the sensory component on the left and a delay of 4.0 on the sensory component on the right side. She is status post carpal tunnel release on the right. Has C7 bilateral radiculopathy. Continues complain of pain and numbness.   Past Medical History:  Diagnosis Date  . DJD (degenerative joint disease)   . Essential hypertension, malignant   . Extrinsic asthma, unspecified    no inhalers  . Glaucoma   . Lactose intolerance   . Osteoarthrosis, unspecified whether generalized or localized, unspecified site   . Osteopenia   . Pure hypercholesterolemia     Past Surgical History:  Procedure Laterality Date  . CARPAL TUNNEL RELEASE  07/25/2011   Procedure: CARPAL TUNNEL RELEASE;  Surgeon: Cammie Sickle., MD;  Location: [facility];  Service: Orthopedics;  Laterality: Right;  . 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 EYE SURGERY     both cataracts  . FRACTURE SURGERY     rt lower leg    Family History  Problem Relation Age of  Onset  . Arthritis Mother   . Cancer Mother   . Heart disease Father   . Asthma Sister    Social History:  reports that she quit smoking about 37 years ago. She has never used smokeless tobacco. She reports current alcohol use. She reports that she does not use drugs.  Allergies: No Known Allergies  No medications prior to admission.    No results found for this or any previous visit (from the past 48 hour(s)).  No results found.   Pertinent items are noted in HPI.  Height 5\' 1"  (1.549 m), weight 56.7 kg.  General appearance: alert, cooperative and appears stated age Head: Normocephalic, without obvious abnormality Neck: no adenopathy and no JVD Resp: clear to auscultation bilaterally Cardio: regular rate and rhythm, S1, S2 normal, no murmur, click, rub or gallop GI: soft, non-tender; bowel sounds normal; no masses,  no organomegaly Extremities: numbness left hand Pulses: 2+ and symmetric Skin: Skin color, texture, turgor normal. No rashes or lesions Neurologic: Grossly normal Incision/Wound: na  Assessment/Plan : Assessment:  1. Bilateral carpal tunnel syndrome  2. Cervicalgia    Plan: We have discussed her nerve conductions with her. We have discussed possibility of surgical decompression to the median nerve on her left side. We discussed pre, peri and  postoperative course with the patient. They are aware that there is no guarantee to the surgery the possibility of infection recurrence and regard reserves tensioning completely symptoms distally. Questions are encouraged answer  to their satisfaction. Conservative treatment has been undertaken without success. Nerve connections are positive. Patient has agreed and requested release of median nerve at the wrist.   Cindee Salt 04/18/2019, 5:49 AM

## 2019-04-18 NOTE — Op Note (Signed)
NAME: Alizea Pell [facility] MEDICAL RECORD NO: [id number] DATE OF BIRTH: 10/10/1940 FACILITY: [facility] LOCATION: [facility] PHYSICIAN: Nicki Reaper, MD   OPERATIVE REPORT   DATE OF PROCEDURE: 04/18/19    PREOPERATIVE DIAGNOSIS:   Carpal tunnel syndrome left hand   POSTOPERATIVE DIAGNOSIS:   Same   PROCEDURE:   Decompression median nerve left hand   SURGEON: Cindee Salt, M.D.   ASSISTANT: none   ANESTHESIA:  Bier block with sedation and Local   INTRAVENOUS FLUIDS:  Per anesthesia flow sheet.   ESTIMATED BLOOD LOSS:  Minimal.   COMPLICATIONS:  None.   SPECIMENS:  none   TOURNIQUET TIME:    Total Tourniquet Time Documented: Forearm (Left) - 26 minutes Total: Forearm (Left) - 26 minutes    DISPOSITION:  Stable to PACU.   INDICATIONS: Patient is a 79 year old female with a history of numbness and tingling both hands.  Nerve conductions are positive.  She has arthritis in her neck and has been consulted regarding that.  She would like to proceed to have her carpal canals median nerve decompressed on her left side.  Conservative treatment has not resolved symptoms for her.  She is aware that there may be a double crush.  Preperi-and postoperative course been discussed along with risks and complications she is aware there is no guarantee to the surgery the possibility of infection recurrence injury to arteries nerves tendons complete relief symptoms dystrophy.  In the preoperative area the patient is seen the extremity marked by both patient and surgeon antibiotic given OPERATIVE COURSE: Patient is brought to the operating room where form based IV regional anesthetic was carried out without difficulty under the direction the anesthesia department.  She was prepped using ChloraPrep in a supine position with the left arm free.  A 3-minute dry time was allowed and a timeout taken to confirm patient procedure.  A longitudinal incision was made left palm carried down through  subcutaneous tissue.  Bleeders were electrocauterized with bipolar.  The palmar fascia was separated.  The superficial palmar arch was identified along with flexor tendon to the ring little finger.  Retractors were placed retracting median nerve flexor tendons radially and the ulnar nerve ulnarly.  Flexor retinaculum was then released on its ulnar border.  Right angle and stool retractor were placed between skin and forearm fascia.  Blunt dissection was used to dissect deep to the tissues from the forearm fascia.  The fascia was then released with the blunt scissors for approximately 2 to 3 cm proximal to the wrist crease under direct vision.  The canal was explored.  Hyperemia of the nerve was immediately apparent along with a compressive portion.  Motor branch entered into muscle distally.  The wound was copious irrigated with saline.  The skin was closed interrupted 4-0 nylon sutures.  Local infiltration quarter percent bupivacaine without epinephrine was given approximately 8 cc was used.  Sterile compressive dressing with the fingers free was applied.  Deflation of the tourniquet all fingers immediately pink.  She was taken to the recovery room for observation in satisfactory condition.  She will be discharged home to [facility] in 1 week on Tylenol ibuprofen for pain with Ultram for breakthrough.  Cindee Salt, MD Electronically signed, 04/18/19

## 2019-04-18 NOTE — Anesthesia Procedure Notes (Signed)
Anesthesia Regional Block: Bier block (IV Regional)   Pre-Anesthetic Checklist: ,, timeout performed, Correct Patient, Correct Site, Correct Laterality, Correct Procedure,, site marked, surgical consent,, at surgeon's request  Laterality: Left     Needles:  Injection technique: Single-shot  Needle Type: Other      Needle Gauge: 20     Additional Needles:   Procedures:,,,,, intact distal pulses, Esmarch exsanguination, single tourniquet utilized,  Narrative:  Start time: 04/18/2019 1:33 PM End time: 04/18/2019 1:34 PM Injection made incrementally with aspirations every 30 mL.  Performed by: Personally

## 2019-04-18 NOTE — Transfer of Care (Signed)
Immediate Anesthesia Transfer of Care Note  Patient: Maria Kelley  Procedure(s) Performed: CARPAL TUNNEL RELEASE (Left Hand)  Patient Location: PACU  Anesthesia Type:Bier block  Level of Consciousness: awake  Airway & Oxygen Therapy: Patient Spontanous Breathing and Patient connected to face mask oxygen  Post-op Assessment: Report given to RN and Post -op Vital signs reviewed and stable  Post vital signs: Reviewed and stable  Last Vitals:  Vitals Value Taken Time  BP    Temp    Pulse 59 04/18/19 1407  Resp 17 04/18/19 1407  SpO2 97 % 04/18/19 1407  Vitals shown include unvalidated device data.  Last Pain:  Vitals:   04/18/19 1157  TempSrc: Oral  PainSc: 0-No pain         Complications: No apparent anesthesia complications

## 2019-04-21 ENCOUNTER — Encounter: Payer: Self-pay | Admitting: *Deleted

## 2019-09-10 DIAGNOSIS — M5030 Other cervical disc degeneration, unspecified cervical region: Secondary | ICD-10-CM | POA: Insufficient documentation

## 2019-09-10 DIAGNOSIS — Z9889 Other specified postprocedural states: Secondary | ICD-10-CM | POA: Insufficient documentation

## 2019-09-27 IMAGING — MR MR HEAD WO/W CM
13 series · 48 of 48 positions shown · IV contrast (multihance)
Comparison: None.

CLINICAL DATA: Hyper reflexia

Creatinine was obtained on site at [HOSPITAL] at [HOSPITAL].
Results: Creatinine 0.6 mg/dL.
EXAM:
MRI HEAD WITHOUT AND WITH CONTRAST
TECHNIQUE: Multiplanar, multiecho pulse sequences of the brain and surrounding
structures were obtained without and with intravenous contrast.
CONTRAST:  10mL MULTIHANCE GADOBENATE DIMEGLUMINE 529 MG/ML IV SOLN

[Series 2: t1_se_sag · sagittal · 5.0mm · 0.45mm/px · 1 of 21 slices shown]
[im 1/21]
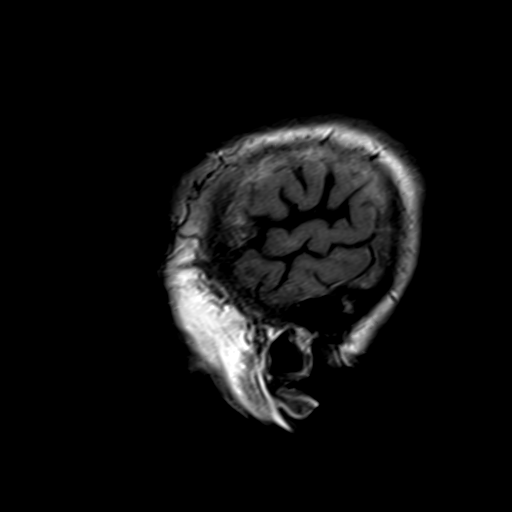

[Series 3: ep2d_diff_(id)_trace · axial · 3.0mm · 1.80mm/px · z∈[-53,+88]mm · 5 of 95 slices shown]
[im 1/95]
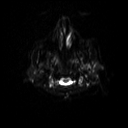
[im 24/95]
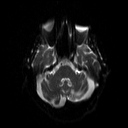
[im 48/95]
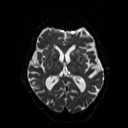
[im 71/95]
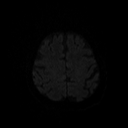
[im 95/95]
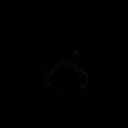

[Series 4: ep2d_diff_(id)_trace_adc · axial · 3.0mm · 1.80mm/px · z∈[-53,+88]mm · 2 of 47 slices shown]
[im 1/47]
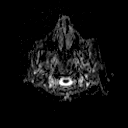
[im 47/47]
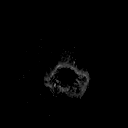

[Series 5: FLAIR · axial · 3.0mm · 0.45mm/px · z∈[-57,+92]mm · 2 of 26 slices shown]
[im 1/26]
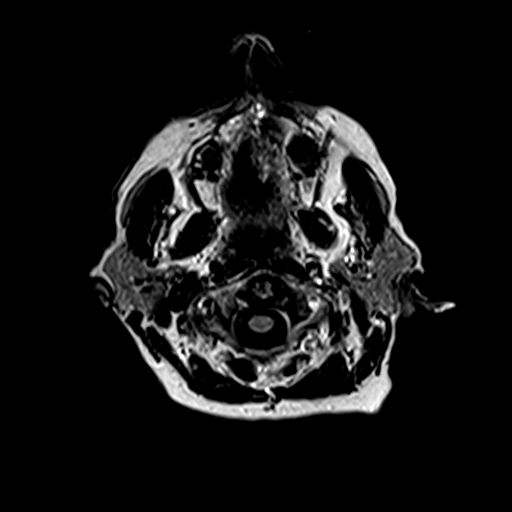
[im 26/26]
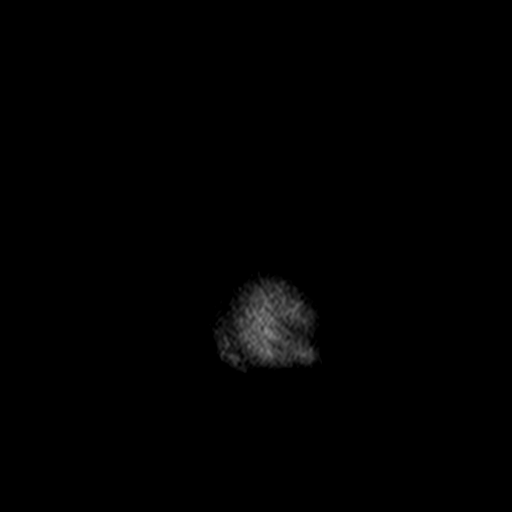

[Series 6: t2_tse_tra · axial · 5.0mm · 0.60mm/px · z∈[-58,+92]mm · 2 of 26 slices shown]
[im 1/26]
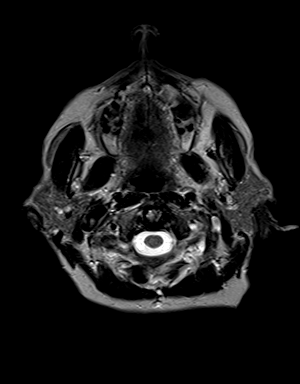
[im 26/26]
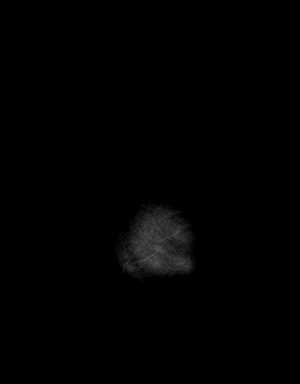

[Series 7: mip_images(sw) · axial · 16.0mm · 0.90mm/px · z∈[-55,+89]mm · 4 of 73 slices shown]
[im 1/73]
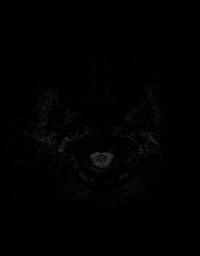
[im 25/73]
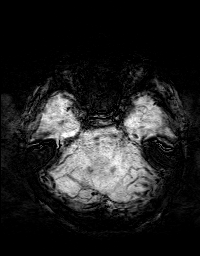
[im 49/73]
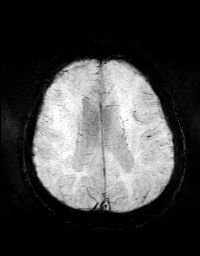
[im 73/73]
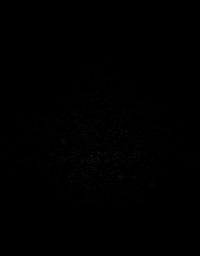

[Series 8: swi_images · axial · 2.0mm · 0.90mm/px · z∈[-62,+96]mm · 5 of 80 slices shown]
[im 1/80]
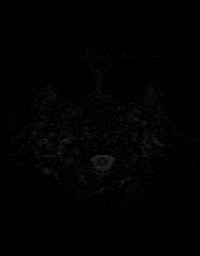
[im 20/80]
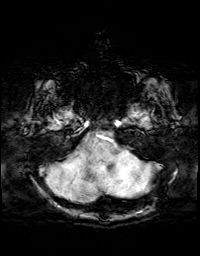
[im 40/80]
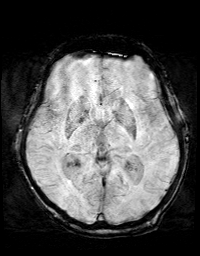
[im 60/80]
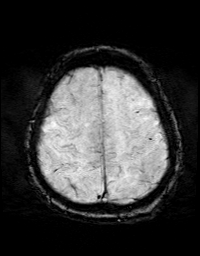
[im 80/80]
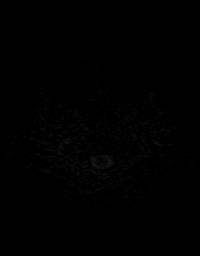

[Series 9: t1_mpr_tra · axial · 1.0mm · 0.72mm/px · z∈[-54,+88]mm · 9 of 144 slices shown]
[im 1/144]
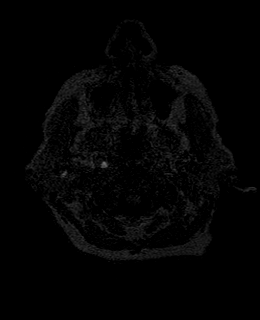
[im 18/144]
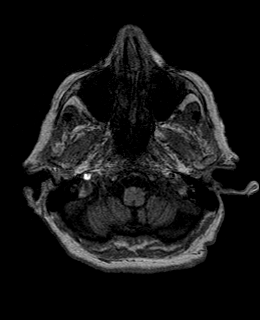
[im 36/144]
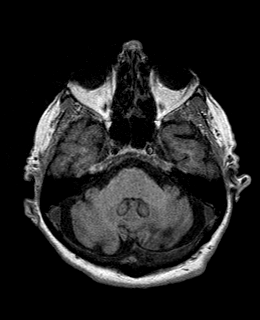
[im 54/144]
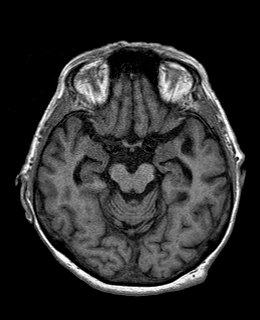
[im 72/144]
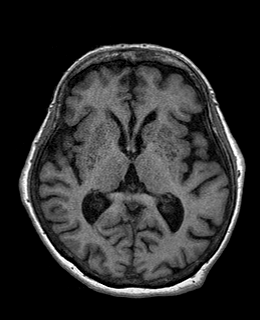
[im 90/144]
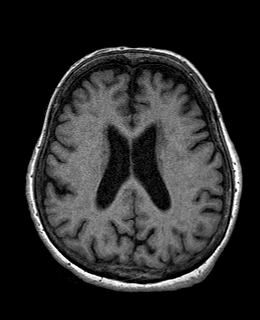
[im 108/144]
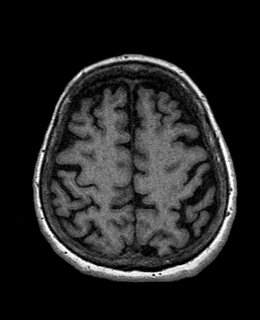
[im 126/144]
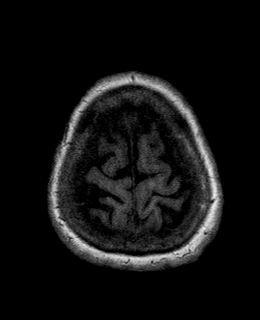
[im 144/144]
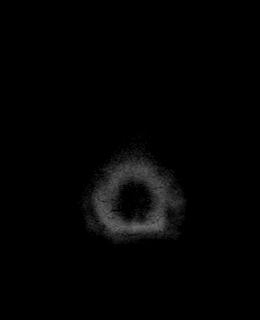

[Series 10: ep2d_diff_cor · coronal · 5.0mm · 1.77mm/px · 3 of 52 slices shown]
[im 1/52]
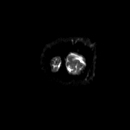
[im 26/52]
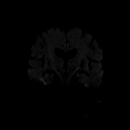
[im 52/52]
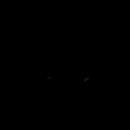

[Series 11: ep2d_diff_cor_adc · coronal · 5.0mm · 1.77mm/px · 2 of 26 slices shown]
[im 1/26]
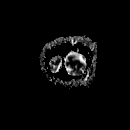
[im 26/26]
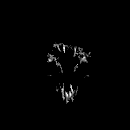

[Series 12: T2 · coronal · 5.0mm · 0.45mm/px · 2 of 28 slices shown]
[im 1/28]
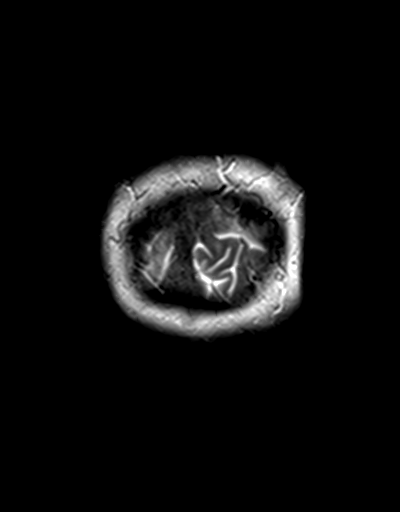
[im 28/28]
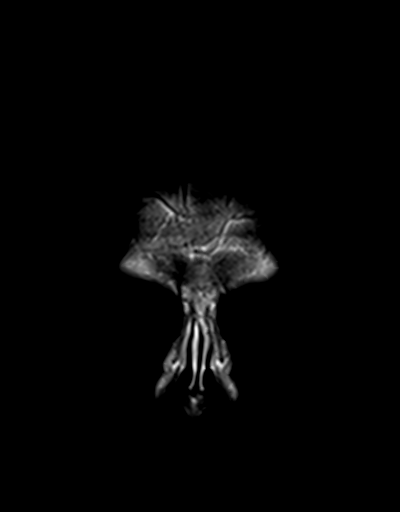

[Series 20: post t1_mpr_tra · axial · 1.0mm · 0.72mm/px · z∈[-54,+88]mm · 9 of 144 slices shown]
[im 1/144]
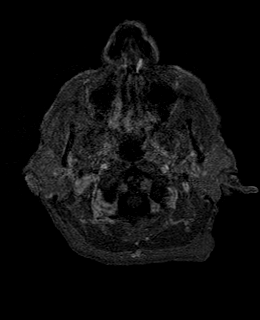
[im 18/144]
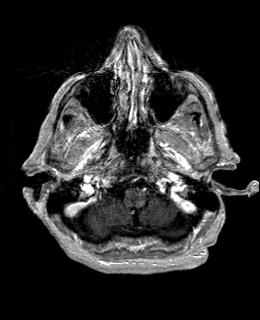
[im 36/144]
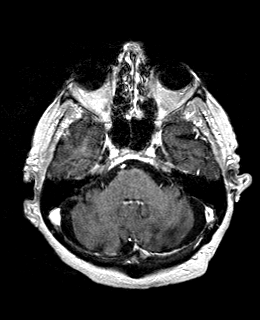
[im 54/144]
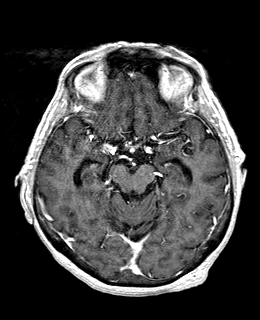
[im 72/144]
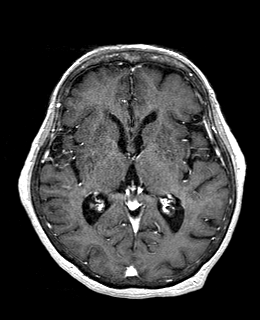
[im 90/144]
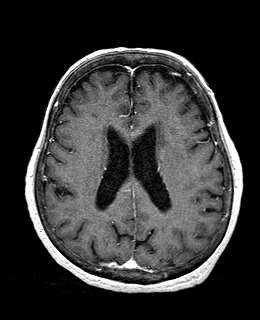
[im 108/144]
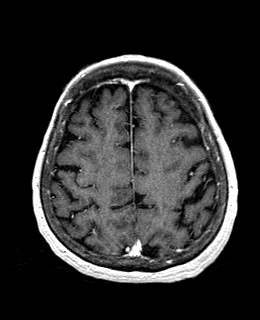
[im 126/144]
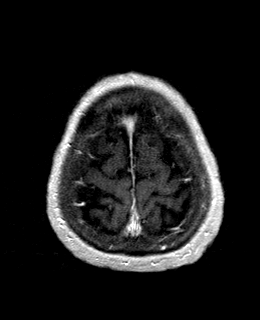
[im 144/144]
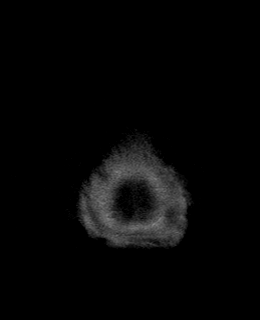

[Series 21: T1 post-contrast · coronal · 5.0mm · 0.72mm/px · 2 of 28 slices shown]
[im 1/28]
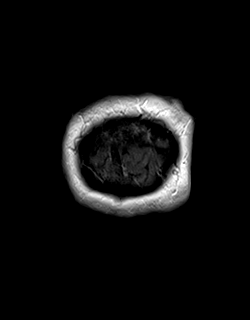
[im 28/28]
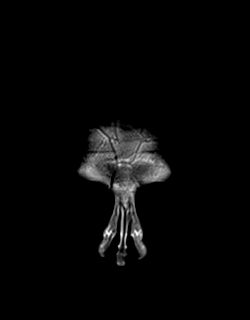

[48 of 48 positions shown; findings below may reference images not displayed]

FINDINGS: Brain: Hyperintensity in the left lateral cerebellum with mild
volume loss and no abnormal enhancement. No associated hemorrhage.
Facilitated diffusion.

Generalized atrophy with mild chronic microvascular ischemia.
Negative for hemorrhage. Normal enhancement postcontrast infusion.

Vascular: Normal arterial flow voids

Skull and upper cervical spine: Negative

Sinuses/Orbits: Mild mucosal edema paranasal sinuses. Bilateral
cataract surgery

Other: None
IMPRESSION: Hyperintensity left lateral cerebellum most compatible with chronic
infarct.

Generalized atrophy and mild chronic microvascular ischemic change
in the white matter. No acute abnormality.

## 2020-03-25 ENCOUNTER — Other Ambulatory Visit: Payer: Self-pay | Admitting: Neurology

## 2020-06-03 DIAGNOSIS — R2681 Unsteadiness on feet: Secondary | ICD-10-CM | POA: Insufficient documentation

## 2020-07-29 ENCOUNTER — Other Ambulatory Visit (HOSPITAL_COMMUNITY): Payer: Self-pay | Admitting: Internal Medicine

## 2020-07-29 DIAGNOSIS — I739 Peripheral vascular disease, unspecified: Secondary | ICD-10-CM

## 2020-07-30 ENCOUNTER — Ambulatory Visit (INDEPENDENT_AMBULATORY_CARE_PROVIDER_SITE_OTHER)
Admission: RE | Admit: 2020-07-30 | Discharge: 2020-07-30 | Disposition: A | Discharge status: Home / Self Care | Payer: Medicare HMO | Source: Ambulatory Visit | Attending: Internal Medicine | Admitting: Internal Medicine

## 2020-07-30 ENCOUNTER — Other Ambulatory Visit: Payer: Self-pay

## 2020-07-30 ENCOUNTER — Ambulatory Visit (HOSPITAL_COMMUNITY)
Admission: RE | Admit: 2020-07-30 | Discharge: 2020-07-30 | Disposition: A | Discharge status: Home / Self Care | Payer: Medicare HMO | Source: Ambulatory Visit | Attending: Internal Medicine | Admitting: Internal Medicine

## 2020-07-30 DIAGNOSIS — I739 Peripheral vascular disease, unspecified: Secondary | ICD-10-CM

## 2021-02-21 DIAGNOSIS — M65321 Trigger finger, right index finger: Secondary | ICD-10-CM | POA: Insufficient documentation

## 2021-04-22 ENCOUNTER — Other Ambulatory Visit: Payer: Self-pay | Admitting: Internal Medicine

## 2021-04-22 DIAGNOSIS — R519 Headache, unspecified: Secondary | ICD-10-CM

## 2021-05-19 ENCOUNTER — Other Ambulatory Visit: Payer: Medicare HMO

## 2021-08-11 ENCOUNTER — Other Ambulatory Visit: Payer: Self-pay | Admitting: Internal Medicine

## 2021-08-11 DIAGNOSIS — R519 Headache, unspecified: Secondary | ICD-10-CM

## 2021-08-17 ENCOUNTER — Ambulatory Visit
Admission: RE | Admit: 2021-08-17 | Discharge: 2021-08-17 | Disposition: A | Discharge status: Home / Self Care | Payer: Medicare HMO | Source: Ambulatory Visit | Attending: Internal Medicine | Admitting: Internal Medicine

## 2021-08-17 DIAGNOSIS — R519 Headache, unspecified: Secondary | ICD-10-CM

## 2021-11-22 ENCOUNTER — Other Ambulatory Visit (HOSPITAL_COMMUNITY): Payer: Self-pay

## 2021-11-22 ENCOUNTER — Ambulatory Visit (HOSPITAL_COMMUNITY)
Admission: RE | Admit: 2021-11-22 | Discharge: 2021-11-22 | Disposition: A | Discharge status: Home / Self Care | Payer: Medicare HMO | Source: Ambulatory Visit | Attending: Internal Medicine | Admitting: Internal Medicine

## 2021-11-22 DIAGNOSIS — M79606 Pain in leg, unspecified: Secondary | ICD-10-CM | POA: Diagnosis present

## 2021-11-22 DIAGNOSIS — M7989 Other specified soft tissue disorders: Secondary | ICD-10-CM | POA: Insufficient documentation

## 2022-01-23 ENCOUNTER — Ambulatory Visit
Admission: RE | Admit: 2022-01-23 | Discharge: 2022-01-23 | Disposition: A | Discharge status: Home / Self Care | Payer: Medicare HMO | Source: Ambulatory Visit | Attending: Internal Medicine | Admitting: Internal Medicine

## 2022-01-23 ENCOUNTER — Other Ambulatory Visit: Payer: Self-pay | Admitting: Internal Medicine

## 2022-01-23 DIAGNOSIS — M25511 Pain in right shoulder: Secondary | ICD-10-CM

## 2022-12-29 ENCOUNTER — Ambulatory Visit
Admission: RE | Admit: 2022-12-29 | Discharge: 2022-12-29 | Disposition: A | Discharge status: Home / Self Care | Payer: Medicare HMO | Source: Ambulatory Visit | Attending: Internal Medicine | Admitting: Internal Medicine

## 2022-12-29 ENCOUNTER — Other Ambulatory Visit: Payer: Self-pay | Admitting: Internal Medicine

## 2022-12-29 DIAGNOSIS — R053 Chronic cough: Secondary | ICD-10-CM

## 2023-02-22 DIAGNOSIS — M48062 Spinal stenosis, lumbar region with neurogenic claudication: Secondary | ICD-10-CM | POA: Diagnosis not present

## 2023-02-22 DIAGNOSIS — M50021 Cervical disc disorder at C4-C5 level with myelopathy: Secondary | ICD-10-CM | POA: Diagnosis not present

## 2023-02-22 DIAGNOSIS — R600 Localized edema: Secondary | ICD-10-CM | POA: Diagnosis not present

## 2023-02-22 DIAGNOSIS — I119 Hypertensive heart disease without heart failure: Secondary | ICD-10-CM | POA: Diagnosis not present

## 2023-02-22 DIAGNOSIS — R053 Chronic cough: Secondary | ICD-10-CM | POA: Diagnosis not present

## 2023-02-22 DIAGNOSIS — I739 Peripheral vascular disease, unspecified: Secondary | ICD-10-CM | POA: Diagnosis not present

## 2023-02-22 DIAGNOSIS — R269 Unspecified abnormalities of gait and mobility: Secondary | ICD-10-CM | POA: Diagnosis not present

## 2023-02-22 DIAGNOSIS — E782 Mixed hyperlipidemia: Secondary | ICD-10-CM | POA: Diagnosis not present

## 2023-02-22 DIAGNOSIS — I672 Cerebral atherosclerosis: Secondary | ICD-10-CM | POA: Diagnosis not present

## 2023-03-06 DIAGNOSIS — N3946 Mixed incontinence: Secondary | ICD-10-CM | POA: Diagnosis not present

## 2023-03-06 DIAGNOSIS — R350 Frequency of micturition: Secondary | ICD-10-CM | POA: Diagnosis not present

## 2023-03-07 DIAGNOSIS — M79642 Pain in left hand: Secondary | ICD-10-CM | POA: Diagnosis not present

## 2023-03-07 DIAGNOSIS — M65332 Trigger finger, left middle finger: Secondary | ICD-10-CM | POA: Diagnosis not present

## 2023-03-07 DIAGNOSIS — M79641 Pain in right hand: Secondary | ICD-10-CM | POA: Diagnosis not present

## 2023-03-26 DIAGNOSIS — E782 Mixed hyperlipidemia: Secondary | ICD-10-CM | POA: Diagnosis not present

## 2023-03-26 DIAGNOSIS — R600 Localized edema: Secondary | ICD-10-CM | POA: Diagnosis not present

## 2023-03-26 DIAGNOSIS — R053 Chronic cough: Secondary | ICD-10-CM | POA: Diagnosis not present

## 2023-03-26 DIAGNOSIS — R269 Unspecified abnormalities of gait and mobility: Secondary | ICD-10-CM | POA: Diagnosis not present

## 2023-03-26 DIAGNOSIS — I119 Hypertensive heart disease without heart failure: Secondary | ICD-10-CM | POA: Diagnosis not present

## 2023-03-26 DIAGNOSIS — I672 Cerebral atherosclerosis: Secondary | ICD-10-CM | POA: Diagnosis not present

## 2023-03-26 DIAGNOSIS — M50021 Cervical disc disorder at C4-C5 level with myelopathy: Secondary | ICD-10-CM | POA: Diagnosis not present

## 2023-03-26 DIAGNOSIS — I739 Peripheral vascular disease, unspecified: Secondary | ICD-10-CM | POA: Diagnosis not present

## 2023-03-26 DIAGNOSIS — M48062 Spinal stenosis, lumbar region with neurogenic claudication: Secondary | ICD-10-CM | POA: Diagnosis not present

## 2023-04-03 DIAGNOSIS — R350 Frequency of micturition: Secondary | ICD-10-CM | POA: Diagnosis not present

## 2023-04-03 DIAGNOSIS — N3946 Mixed incontinence: Secondary | ICD-10-CM | POA: Diagnosis not present

## 2023-04-04 DIAGNOSIS — M65332 Trigger finger, left middle finger: Secondary | ICD-10-CM | POA: Diagnosis not present

## 2023-04-04 DIAGNOSIS — M79641 Pain in right hand: Secondary | ICD-10-CM | POA: Diagnosis not present

## 2023-04-05 ENCOUNTER — Ambulatory Visit: Payer: Medicare HMO | Admitting: Podiatry

## 2023-04-05 ENCOUNTER — Encounter: Payer: Self-pay | Admitting: Podiatry

## 2023-04-05 DIAGNOSIS — M79676 Pain in unspecified toe(s): Secondary | ICD-10-CM

## 2023-04-05 DIAGNOSIS — B351 Tinea unguium: Secondary | ICD-10-CM

## 2023-04-07 NOTE — Progress Notes (Signed)
 Subjective:  Patient ID: Maria Kelley, female    DOB: 07-09-1940,  MRN: [id number] HPI Chief Complaint  Patient presents with   Debridement    Hallux right - toenail thick and dark x years, PCP Rx'd cream but hasn't helped   New Patient (Initial Visit)    83 y.o. female presents with the above complaint.   ROS: Denies fever chills nausea vomit muscle aches pains calf pain back pain chest pain shortness of breath.  Past Medical History:  Diagnosis Date   DJD (degenerative joint disease)    Essential hypertension, malignant    Extrinsic asthma, unspecified    no inhalers   Glaucoma    Lactose intolerance    Osteoarthrosis, unspecified whether generalized or localized, unspecified site    Osteopenia    Pure hypercholesterolemia    Past Surgical History:  Procedure Laterality Date   CARPAL TUNNEL RELEASE  07/25/2011   Procedure: CARPAL TUNNEL RELEASE;  Surgeon: Wyn Forster., MD;  Location: [facility];  Service: Orthopedics;  Laterality: Right;   CARPAL TUNNEL RELEASE Left 04/18/2019   Procedure: CARPAL TUNNEL RELEASE;  Surgeon: Cindee Salt, MD;  Location: [facility];  Service: Orthopedics;  Laterality: Left;  FOREARM BLOCK   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   Procedure: DUPUYTREN CONTRACTURE RELEASE;  Surgeon: Wyn Forster., MD;  Location: [facility];  Service: Orthopedics;  Laterality: Right;  Resection Right Long Dupuytrens   EYE SURGERY     both cataracts   FRACTURE SURGERY     rt lower leg    Current Outpatient Medications:    ADVAIR HFA 115-21 MCG/ACT inhaler, , Disp: , Rfl:    albuterol (VENTOLIN HFA) 108 (90 Base) MCG/ACT inhaler, 2 PUFFS MOUTH 4 TIMES A DAY AS NEEDED FOR WHEEZING, Disp: , Rfl:    Azelastine HCl 137 MCG/SPRAY SOLN, INHALE 2 SPRAYS NASALLY 2 TIMES A DAY, Disp: , Rfl:    ciclopirox (LOPROX) 0.77 % cream, APPLY 1 APPLICATION 2 TIMES A DAY TO AFFECTED TOENAIL, Disp: , Rfl:    losartan  (COZAAR) 50 MG tablet, Take by mouth., Disp: , Rfl:    montelukast (SINGULAIR) 10 MG tablet, Take 1 tablet by mouth daily., Disp: , Rfl:    amLODipine (NORVASC) 5 MG tablet, Take 5 mg by mouth daily., Disp: , Rfl:    baclofen (LIORESAL) 10 MG tablet, TAKE 1 TABLET BY MOUTH EVERYDAY AT BEDTIME, Disp: 90 tablet, Rfl: 3   dorzolamide-timolol (COSOPT) 2-0.5 % ophthalmic solution, 1 drop 2 (two) times daily., Disp: , Rfl:    ezetimibe (ZETIA) 10 MG tablet, Take 10 mg by mouth daily., Disp: , Rfl:    ibuprofen (ADVIL,MOTRIN) 200 MG tablet, Take 400 mg by mouth every 6 (six) hours as needed for headache or moderate pain., Disp: , Rfl:    metoprolol (LOPRESSOR) 50 MG tablet, Take 1 tablet by mouth 2 (two) times daily., Disp: , Rfl:    solifenacin (VESICARE) 5 MG tablet, Take 5 mg by mouth daily., Disp: , Rfl:    timolol (TIMOPTIC) 0.5 % ophthalmic solution, Place 1 drop into both eyes 2 (two) times daily., Disp: , Rfl: 3   travoprost, benzalkonium, (TRAVATAN Z) 0.004 % ophthalmic solution, Place 1 drop into both eyes at bedtime. , Disp: , Rfl:   No Known Allergies Review of Systems Objective:  There were no vitals filed for this visit.  General: Well developed, nourished, in no acute distress, alert and oriented x3  Dermatological: Skin is warm, dry and supple bilateral. Nails x 10 are well maintained; remaining integument appears unremarkable at this time. There are no open sores, no preulcerative lesions, no rash or signs of infection present.  Hallux nail right is thick discolored and dystrophic appears to have blood underneath it.  The blood is dried does not appear to be an abscess.  Vascular: Dorsalis Pedis artery and Posterior Tibial artery pedal pulses are 2/4 bilateral with immedate capillary fill time. Pedal hair growth present. No varicosities and no lower extremity edema present bilateral.   Neruologic: Grossly intact via light touch bilateral. Vibratory intact via tuning fork bilateral.  Protective threshold with Semmes Wienstein monofilament intact to all pedal sites bilateral. Patellar and Achilles deep tendon reflexes 2+ bilateral. No Babinski or clonus noted bilateral.   Musculoskeletal: No gross boney pedal deformities bilateral. No pain, crepitus, or limitation noted with foot and ankle range of motion bilateral. Muscular strength 5/5 in all groups tested bilateral.  Gait: Unassisted, Nonantalgic.    Radiographs:  None taken  Assessment & Plan:   Assessment: Nail dystrophy with subungual hematoma  Plan: Debridement of nail instructed her to keep the nail cut short and follow-up then.     Lajean Boese T. [place], [place]

## 2023-04-09 ENCOUNTER — Other Ambulatory Visit: Payer: Self-pay | Admitting: Physician Assistant

## 2023-04-09 DIAGNOSIS — M79641 Pain in right hand: Secondary | ICD-10-CM

## 2023-04-23 DIAGNOSIS — Z1283 Encounter for screening for malignant neoplasm of skin: Secondary | ICD-10-CM | POA: Diagnosis not present

## 2023-04-23 DIAGNOSIS — L820 Inflamed seborrheic keratosis: Secondary | ICD-10-CM | POA: Diagnosis not present

## 2023-04-23 DIAGNOSIS — D225 Melanocytic nevi of trunk: Secondary | ICD-10-CM | POA: Diagnosis not present

## 2023-04-27 DIAGNOSIS — H401131 Primary open-angle glaucoma, bilateral, mild stage: Secondary | ICD-10-CM | POA: Diagnosis not present

## 2023-04-27 DIAGNOSIS — H18593 Other hereditary corneal dystrophies, bilateral: Secondary | ICD-10-CM | POA: Diagnosis not present

## 2023-05-01 DIAGNOSIS — N3946 Mixed incontinence: Secondary | ICD-10-CM | POA: Diagnosis not present

## 2023-05-01 DIAGNOSIS — R350 Frequency of micturition: Secondary | ICD-10-CM | POA: Diagnosis not present

## 2023-05-03 DIAGNOSIS — R350 Frequency of micturition: Secondary | ICD-10-CM | POA: Diagnosis not present

## 2023-05-03 DIAGNOSIS — N3946 Mixed incontinence: Secondary | ICD-10-CM | POA: Diagnosis not present

## 2023-05-04 ENCOUNTER — Encounter: Payer: Self-pay | Admitting: Physician Assistant

## 2023-05-08 ENCOUNTER — Ambulatory Visit
Admission: RE | Admit: 2023-05-08 | Discharge: 2023-05-08 | Disposition: A | Discharge status: Home / Self Care | Source: Ambulatory Visit | Attending: Physician Assistant | Admitting: Physician Assistant

## 2023-05-08 DIAGNOSIS — M79641 Pain in right hand: Secondary | ICD-10-CM

## 2023-05-24 DIAGNOSIS — R600 Localized edema: Secondary | ICD-10-CM | POA: Diagnosis not present

## 2023-05-24 DIAGNOSIS — M50021 Cervical disc disorder at C4-C5 level with myelopathy: Secondary | ICD-10-CM | POA: Diagnosis not present

## 2023-05-24 DIAGNOSIS — M48062 Spinal stenosis, lumbar region with neurogenic claudication: Secondary | ICD-10-CM | POA: Diagnosis not present

## 2023-05-24 DIAGNOSIS — E782 Mixed hyperlipidemia: Secondary | ICD-10-CM | POA: Diagnosis not present

## 2023-05-24 DIAGNOSIS — R269 Unspecified abnormalities of gait and mobility: Secondary | ICD-10-CM | POA: Diagnosis not present

## 2023-05-24 DIAGNOSIS — I119 Hypertensive heart disease without heart failure: Secondary | ICD-10-CM | POA: Diagnosis not present

## 2023-05-24 DIAGNOSIS — B351 Tinea unguium: Secondary | ICD-10-CM | POA: Diagnosis not present

## 2023-05-24 DIAGNOSIS — I672 Cerebral atherosclerosis: Secondary | ICD-10-CM | POA: Diagnosis not present

## 2023-05-24 DIAGNOSIS — I739 Peripheral vascular disease, unspecified: Secondary | ICD-10-CM | POA: Diagnosis not present

## 2023-05-29 DIAGNOSIS — R350 Frequency of micturition: Secondary | ICD-10-CM | POA: Diagnosis not present

## 2023-05-30 DIAGNOSIS — M778 Other enthesopathies, not elsewhere classified: Secondary | ICD-10-CM | POA: Diagnosis not present

## 2023-05-30 DIAGNOSIS — M79641 Pain in right hand: Secondary | ICD-10-CM | POA: Diagnosis not present

## 2023-06-26 DIAGNOSIS — R350 Frequency of micturition: Secondary | ICD-10-CM | POA: Diagnosis not present

## 2023-06-28 DIAGNOSIS — M48062 Spinal stenosis, lumbar region with neurogenic claudication: Secondary | ICD-10-CM | POA: Diagnosis not present

## 2023-06-28 DIAGNOSIS — R12 Heartburn: Secondary | ICD-10-CM | POA: Diagnosis not present

## 2023-06-28 DIAGNOSIS — R600 Localized edema: Secondary | ICD-10-CM | POA: Diagnosis not present

## 2023-06-28 DIAGNOSIS — I119 Hypertensive heart disease without heart failure: Secondary | ICD-10-CM | POA: Diagnosis not present

## 2023-06-28 DIAGNOSIS — E782 Mixed hyperlipidemia: Secondary | ICD-10-CM | POA: Diagnosis not present

## 2023-06-28 DIAGNOSIS — J4521 Mild intermittent asthma with (acute) exacerbation: Secondary | ICD-10-CM | POA: Diagnosis not present

## 2023-06-28 DIAGNOSIS — I672 Cerebral atherosclerosis: Secondary | ICD-10-CM | POA: Diagnosis not present

## 2023-06-28 DIAGNOSIS — M50021 Cervical disc disorder at C4-C5 level with myelopathy: Secondary | ICD-10-CM | POA: Diagnosis not present

## 2023-06-28 DIAGNOSIS — I739 Peripheral vascular disease, unspecified: Secondary | ICD-10-CM | POA: Diagnosis not present

## 2023-07-24 DIAGNOSIS — R350 Frequency of micturition: Secondary | ICD-10-CM | POA: Diagnosis not present

## 2023-07-25 DIAGNOSIS — H18523 Epithelial (juvenile) corneal dystrophy, bilateral: Secondary | ICD-10-CM | POA: Diagnosis not present

## 2023-07-25 DIAGNOSIS — H401130 Primary open-angle glaucoma, bilateral, stage unspecified: Secondary | ICD-10-CM | POA: Diagnosis not present

## 2023-07-25 DIAGNOSIS — Z961 Presence of intraocular lens: Secondary | ICD-10-CM | POA: Diagnosis not present

## 2023-07-30 DIAGNOSIS — H18593 Other hereditary corneal dystrophies, bilateral: Secondary | ICD-10-CM | POA: Diagnosis not present

## 2023-07-30 DIAGNOSIS — H401131 Primary open-angle glaucoma, bilateral, mild stage: Secondary | ICD-10-CM | POA: Diagnosis not present

## 2023-08-20 DIAGNOSIS — H18591 Other hereditary corneal dystrophies, right eye: Secondary | ICD-10-CM | POA: Diagnosis not present

## 2023-08-20 DIAGNOSIS — H1789 Other corneal scars and opacities: Secondary | ICD-10-CM | POA: Diagnosis not present

## 2023-09-06 DIAGNOSIS — I119 Hypertensive heart disease without heart failure: Secondary | ICD-10-CM | POA: Diagnosis not present

## 2023-09-06 DIAGNOSIS — I672 Cerebral atherosclerosis: Secondary | ICD-10-CM | POA: Diagnosis not present

## 2023-09-06 DIAGNOSIS — M48062 Spinal stenosis, lumbar region with neurogenic claudication: Secondary | ICD-10-CM | POA: Diagnosis not present

## 2023-09-06 DIAGNOSIS — I872 Venous insufficiency (chronic) (peripheral): Secondary | ICD-10-CM | POA: Diagnosis not present

## 2023-09-06 DIAGNOSIS — J4521 Mild intermittent asthma with (acute) exacerbation: Secondary | ICD-10-CM | POA: Diagnosis not present

## 2023-09-06 DIAGNOSIS — R600 Localized edema: Secondary | ICD-10-CM | POA: Diagnosis not present

## 2023-09-06 DIAGNOSIS — M50021 Cervical disc disorder at C4-C5 level with myelopathy: Secondary | ICD-10-CM | POA: Diagnosis not present

## 2023-09-06 DIAGNOSIS — I739 Peripheral vascular disease, unspecified: Secondary | ICD-10-CM | POA: Diagnosis not present

## 2023-09-06 DIAGNOSIS — E782 Mixed hyperlipidemia: Secondary | ICD-10-CM | POA: Diagnosis not present

## 2023-09-18 DIAGNOSIS — R350 Frequency of micturition: Secondary | ICD-10-CM | POA: Diagnosis not present

## 2023-10-16 DIAGNOSIS — R350 Frequency of micturition: Secondary | ICD-10-CM | POA: Diagnosis not present

## 2023-11-05 ENCOUNTER — Other Ambulatory Visit: Payer: Self-pay

## 2023-11-05 DIAGNOSIS — M79606 Pain in leg, unspecified: Secondary | ICD-10-CM

## 2023-11-23 DIAGNOSIS — R350 Frequency of micturition: Secondary | ICD-10-CM | POA: Diagnosis not present

## 2023-12-11 DIAGNOSIS — I872 Venous insufficiency (chronic) (peripheral): Secondary | ICD-10-CM | POA: Diagnosis not present

## 2023-12-11 DIAGNOSIS — J4521 Mild intermittent asthma with (acute) exacerbation: Secondary | ICD-10-CM | POA: Diagnosis not present

## 2023-12-11 DIAGNOSIS — I672 Cerebral atherosclerosis: Secondary | ICD-10-CM | POA: Diagnosis not present

## 2023-12-11 DIAGNOSIS — I739 Peripheral vascular disease, unspecified: Secondary | ICD-10-CM | POA: Diagnosis not present

## 2023-12-11 DIAGNOSIS — M48062 Spinal stenosis, lumbar region with neurogenic claudication: Secondary | ICD-10-CM | POA: Diagnosis not present

## 2023-12-11 DIAGNOSIS — Z136 Encounter for screening for cardiovascular disorders: Secondary | ICD-10-CM | POA: Diagnosis not present

## 2023-12-11 DIAGNOSIS — E869 Volume depletion, unspecified: Secondary | ICD-10-CM | POA: Diagnosis not present

## 2023-12-11 DIAGNOSIS — I119 Hypertensive heart disease without heart failure: Secondary | ICD-10-CM | POA: Diagnosis not present

## 2023-12-11 DIAGNOSIS — R600 Localized edema: Secondary | ICD-10-CM | POA: Diagnosis not present

## 2023-12-11 DIAGNOSIS — M50021 Cervical disc disorder at C4-C5 level with myelopathy: Secondary | ICD-10-CM | POA: Diagnosis not present

## 2023-12-11 DIAGNOSIS — E782 Mixed hyperlipidemia: Secondary | ICD-10-CM | POA: Diagnosis not present

## 2023-12-11 DIAGNOSIS — R350 Frequency of micturition: Secondary | ICD-10-CM | POA: Diagnosis not present

## 2023-12-12 ENCOUNTER — Ambulatory Visit (HOSPITAL_COMMUNITY): Admission: RE | Admit: 2023-12-12 | Source: Ambulatory Visit

## 2023-12-12 ENCOUNTER — Ambulatory Visit (HOSPITAL_COMMUNITY)

## 2023-12-19 DIAGNOSIS — H5201 Hypermetropia, right eye: Secondary | ICD-10-CM | POA: Diagnosis not present

## 2023-12-19 DIAGNOSIS — H1789 Other corneal scars and opacities: Secondary | ICD-10-CM | POA: Diagnosis not present

## 2023-12-19 DIAGNOSIS — H04123 Dry eye syndrome of bilateral lacrimal glands: Secondary | ICD-10-CM | POA: Diagnosis not present

## 2023-12-19 DIAGNOSIS — Z961 Presence of intraocular lens: Secondary | ICD-10-CM | POA: Diagnosis not present

## 2023-12-25 DIAGNOSIS — R350 Frequency of micturition: Secondary | ICD-10-CM | POA: Diagnosis not present
